# Patient Record
Sex: Female | Born: 1945 | Race: White | Hispanic: No | Smoking: Never smoker
Health system: Southern US, Community
[De-identification: ages and names within clinical notes are randomized; demographics above are authoritative.]

## PROBLEM LIST (undated history)

## (undated) DIAGNOSIS — T753XXA Motion sickness, initial encounter: Secondary | ICD-10-CM

## (undated) DIAGNOSIS — E782 Mixed hyperlipidemia: Secondary | ICD-10-CM

## (undated) DIAGNOSIS — N840 Polyp of corpus uteri: Secondary | ICD-10-CM

## (undated) DIAGNOSIS — I10 Essential (primary) hypertension: Secondary | ICD-10-CM

## (undated) DIAGNOSIS — N938 Other specified abnormal uterine and vaginal bleeding: Secondary | ICD-10-CM

## (undated) HISTORY — PX: BREAST SURGERY: SHX581

## (undated) HISTORY — PX: BREAST EXCISIONAL BIOPSY: SUR124

## (undated) HISTORY — DX: Mixed hyperlipidemia: E78.2

## (undated) HISTORY — PX: DILATION AND CURETTAGE OF UTERUS: SHX78

## (undated) HISTORY — PX: TUBAL LIGATION: SHX77

## (undated) HISTORY — DX: Other specified abnormal uterine and vaginal bleeding: N93.8

## (undated) HISTORY — PX: APPENDECTOMY: SHX54

## (undated) HISTORY — DX: Polyp of corpus uteri: N84.0

---

## 1999-09-26 ENCOUNTER — Ambulatory Visit (HOSPITAL_COMMUNITY): Admission: RE | Admit: 1999-09-26 | Discharge: 1999-09-26 | Payer: Self-pay | Admitting: Gastroenterology

## 2000-03-11 ENCOUNTER — Encounter (INDEPENDENT_AMBULATORY_CARE_PROVIDER_SITE_OTHER): Payer: Self-pay

## 2000-03-11 ENCOUNTER — Other Ambulatory Visit: Admission: RE | Admit: 2000-03-11 | Discharge: 2000-03-11 | Payer: Self-pay | Admitting: Obstetrics and Gynecology

## 2003-04-07 ENCOUNTER — Other Ambulatory Visit: Admission: RE | Admit: 2003-04-07 | Discharge: 2003-04-07 | Payer: Self-pay | Admitting: Obstetrics and Gynecology

## 2003-07-31 ENCOUNTER — Encounter: Admission: RE | Admit: 2003-07-31 | Discharge: 2003-07-31 | Payer: Self-pay | Admitting: Gastroenterology

## 2003-07-31 ENCOUNTER — Encounter: Payer: Self-pay | Admitting: Gastroenterology

## 2004-04-09 ENCOUNTER — Other Ambulatory Visit: Admission: RE | Admit: 2004-04-09 | Discharge: 2004-04-09 | Payer: Self-pay | Admitting: Obstetrics and Gynecology

## 2004-09-18 ENCOUNTER — Ambulatory Visit (HOSPITAL_COMMUNITY): Admission: RE | Admit: 2004-09-18 | Discharge: 2004-09-18 | Payer: Self-pay | Admitting: Gastroenterology

## 2005-04-15 ENCOUNTER — Other Ambulatory Visit: Admission: RE | Admit: 2005-04-15 | Discharge: 2005-04-15 | Payer: Self-pay | Admitting: Obstetrics and Gynecology

## 2005-11-06 ENCOUNTER — Encounter (INDEPENDENT_AMBULATORY_CARE_PROVIDER_SITE_OTHER): Payer: Self-pay | Admitting: *Deleted

## 2005-11-06 ENCOUNTER — Ambulatory Visit (HOSPITAL_BASED_OUTPATIENT_CLINIC_OR_DEPARTMENT_OTHER): Admission: RE | Admit: 2005-11-06 | Discharge: 2005-11-06 | Payer: Self-pay | Admitting: Orthopedic Surgery

## 2006-04-21 ENCOUNTER — Other Ambulatory Visit: Admission: RE | Admit: 2006-04-21 | Discharge: 2006-04-21 | Payer: Self-pay | Admitting: Obstetrics and Gynecology

## 2007-04-23 ENCOUNTER — Other Ambulatory Visit: Admission: RE | Admit: 2007-04-23 | Discharge: 2007-04-23 | Payer: Self-pay | Admitting: Obstetrics and Gynecology

## 2008-05-08 ENCOUNTER — Other Ambulatory Visit: Admission: RE | Admit: 2008-05-08 | Discharge: 2008-05-08 | Payer: Self-pay | Admitting: Obstetrics and Gynecology

## 2008-11-06 ENCOUNTER — Encounter: Admission: RE | Admit: 2008-11-06 | Discharge: 2008-11-06 | Payer: Self-pay | Admitting: Gastroenterology

## 2009-05-10 ENCOUNTER — Ambulatory Visit: Payer: Self-pay | Admitting: Obstetrics and Gynecology

## 2009-05-10 ENCOUNTER — Other Ambulatory Visit: Admission: RE | Admit: 2009-05-10 | Discharge: 2009-05-10 | Payer: Self-pay | Admitting: Obstetrics and Gynecology

## 2009-05-10 ENCOUNTER — Encounter: Payer: Self-pay | Admitting: Obstetrics and Gynecology

## 2009-11-13 LAB — HM COLONOSCOPY

## 2010-05-13 ENCOUNTER — Ambulatory Visit: Payer: Self-pay | Admitting: Obstetrics and Gynecology

## 2010-05-13 ENCOUNTER — Other Ambulatory Visit: Admission: RE | Admit: 2010-05-13 | Discharge: 2010-05-13 | Payer: Self-pay | Admitting: Obstetrics and Gynecology

## 2010-10-18 ENCOUNTER — Encounter
Admission: RE | Admit: 2010-10-18 | Discharge: 2010-10-18 | Payer: Self-pay | Source: Home / Self Care | Attending: Gastroenterology | Admitting: Gastroenterology

## 2010-10-21 ENCOUNTER — Ambulatory Visit
Admission: RE | Admit: 2010-10-21 | Discharge: 2010-10-21 | Payer: Self-pay | Source: Home / Self Care | Attending: Obstetrics and Gynecology | Admitting: Obstetrics and Gynecology

## 2010-10-28 ENCOUNTER — Ambulatory Visit
Admission: RE | Admit: 2010-10-28 | Discharge: 2010-10-28 | Payer: Self-pay | Source: Home / Self Care | Attending: Obstetrics and Gynecology | Admitting: Obstetrics and Gynecology

## 2011-02-21 NOTE — Op Note (Signed)
NAME:  [name], [name] NO.:  [id number]   MEDICAL RECORD NO.:  [id number]          PATIENT TYPE:  AMB   LOCATION:  ENDO                         FACILITY:  [facility]   PHYSICIAN:  Danise Edge, M.D.   DATE OF BIRTH:  1945-11-01   DATE OF PROCEDURE:  09/18/2004  DATE OF DISCHARGE:                                 OPERATIVE REPORT   PROCEDURE:  Surveillance colonoscopy.   PROCEDURE INDICATION:  [name] is a 65 year old female, born  12/30/1945.  [name] has a positive family history of colon cancer  and is scheduled to undergo a screening colonoscopy with polypectomy to  prevent colon cancer.   ENDOSCOPIST:  Danise Edge, M.D.   PREMEDICATION:  1.  Versed 6 mg.  2.  Demerol 60 mg.   DESCRIPTION OF PROCEDURE:  After obtaining informed consent, [name] was  placed in the left lateral decubitus position.  I administered intravenous  Demerol and intravenous Versed to achieve conscious sedation for the  procedure.  The patient's blood pressure, oxygen saturation, and cardiac  rhythm were monitored throughout the procedure and documented in the medical  record.   Anal inspection and digital rectal exam were normal.  The Olympus adjustable  pediatric colonoscope was introduced into the rectum and advanced to the  cecum.  Colonic preparation for the exam today was excellent.   RECTUM:  Normal.  SIGMOID COLON AND DESCENDING COLON:  Normal.  SPLENIC FLEXURE:  Normal.  TRANSVERSE COLON:  Normal.  HEPATIC FLEXURE:  Normal.  ASCENDING COLON:  Normal.  CECUM AND ILEOCECAL VALVE:  Normal.   ASSESSMENT:  Normal proctocolonoscopy to the cecum.      Shelia Taylor/MEDQ  D:  09/18/2004  T:  09/18/2004  Job:  [id number]

## 2011-02-21 NOTE — Op Note (Signed)
NAME:  Shelia Taylor, Shelia Taylor             ACCOUNT NO.:  [id number]   MEDICAL RECORD NO.:  [id number]          PATIENT TYPE:  AMB   LOCATION:  DSC                          FACILITY:  [facility]   PHYSICIAN:  Katy Fitch. Sypher, M.D. DATE OF BIRTH:  [birth year]   DATE OF PROCEDURE:  11/06/2005  DATE OF DISCHARGE:                                 OPERATIVE REPORT   PREOPERATIVE DIAGNOSIS:  Mass palmar surface of right ring finger PIP joint  adjacent to flexor sheath.   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   OPERATION:  Excisional biopsy of right ring finger volar mass.   OPERATIONS:  Shelia Taylor, M.D.   ASSISTANT:  Shelia Maduro Dasnoit PA-C.   ANESTHESIA:  Light sedation and 2% lidocaine, 0.25% Marcaine metacarpal head  level block of right ring finger.   SUPERVISING ANESTHESIOLOGIST:  Shelia Taylor.   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   She had noted this several months prior and brought it to Shelia Taylor  attention.   She is referred for excisional biopsy.   After informed consent, she is brought to the operating room at this time.   PROCEDURE:  Shelia Taylor is brought to operating room and placed in  supine position on the operating table.   Following an anesthesia consultation by Shelia Taylor, light sedation and  digital block was selected as appropriate.   Following routine Betadine prep, 0.25% Marcaine and 2% lidocaine were  infiltrated at the metacarpal head level of the right ring finger.   The right arm was prepped with Betadine soap and solution, sterilely draped.  A pneumatic tourniquet was applied to the proximal right brachium.   Following exsanguination of the right hand and arm with an Esmarch bandage,  the arterial tourniquet  was inflated to 220 mmHg.   Procedure commenced with an oblique incision directly over the palpably  enlarged area.  Subcutaneous tissue was carefully divided, revealing a  purplish red mass that appeared to be adherent to both the veins and several  of the cutaneous nerves of this region.   This raised the question of a possible glomus tumor.   This did not have the appearance of a typical ganglion. We have, however,  seen mucinous lesions grow in the walls of veins. Therefore, this could be  in a typical ganglion type process.   The mass was circumferentially dissected, separated from the dermis and  resected with small portion of the feeding veins and the nerves adjacent to  it.   This was passed off in formalin for pathologic evaluation.   The wound is then repaired with interrupted sutures of 5-0 nylon.   A compressive dressing was applied.   There were no apparent complications.      Katy Fitch Sypher, M.D.  Electronically Signed     RVS/MEDQ  D:  11/06/2005  T:  11/06/2005  Job:  [id number]

## 2011-07-08 ENCOUNTER — Encounter: Payer: Self-pay | Admitting: Gynecology

## 2011-07-08 DIAGNOSIS — N938 Other specified abnormal uterine and vaginal bleeding: Secondary | ICD-10-CM | POA: Insufficient documentation

## 2011-07-08 DIAGNOSIS — M810 Age-related osteoporosis without current pathological fracture: Secondary | ICD-10-CM | POA: Insufficient documentation

## 2011-07-08 DIAGNOSIS — N840 Polyp of corpus uteri: Secondary | ICD-10-CM | POA: Insufficient documentation

## 2011-07-16 ENCOUNTER — Encounter: Payer: Self-pay | Admitting: Obstetrics and Gynecology

## 2011-08-07 ENCOUNTER — Ambulatory Visit (INDEPENDENT_AMBULATORY_CARE_PROVIDER_SITE_OTHER): Payer: Managed Care, Other (non HMO) | Admitting: Obstetrics and Gynecology

## 2011-08-07 ENCOUNTER — Other Ambulatory Visit (HOSPITAL_COMMUNITY)
Admission: RE | Admit: 2011-08-07 | Discharge: 2011-08-07 | Disposition: A | Discharge status: Home / Self Care | Payer: Managed Care, Other (non HMO) | Source: Ambulatory Visit | Attending: Obstetrics and Gynecology | Admitting: Obstetrics and Gynecology

## 2011-08-07 ENCOUNTER — Encounter: Payer: Self-pay | Admitting: Obstetrics and Gynecology

## 2011-08-07 VITALS — BP 122/70 | Ht 66.5 in | Wt 129.0 lb

## 2011-08-07 DIAGNOSIS — M8580 Other specified disorders of bone density and structure, unspecified site: Secondary | ICD-10-CM

## 2011-08-07 DIAGNOSIS — M949 Disorder of cartilage, unspecified: Secondary | ICD-10-CM

## 2011-08-07 DIAGNOSIS — Z01419 Encounter for gynecological examination (general) (routine) without abnormal findings: Secondary | ICD-10-CM | POA: Insufficient documentation

## 2011-08-07 NOTE — Progress Notes (Signed)
Patient came to see me today for her annual GYN exam. We saw her her in January of this year for left lower quadrant pain. She had a normal ultrasound. She's been evaluated both by her GI doctor and her urologist. So far no significant disorder has been found. She says her pain is almost gone. She remains on Evista for low bone mass. She is doing well and has had no fractures. She is having some back pain and is  Going back to Dr. Laural Benes for reassessment. She's having no vaginal bleeding. She does her lab work through her PCP.  Physical examination: Kennon Portela present HEENT within normal limits. Neck: Thyroid not large. No masses. Supraclavicular nodes: not enlarged. Breasts: Examined in both sitting midline position. No skin changes and no masses. Abdomen: Soft no guarding rebound or masses or hernia. Pelvic: External: Within normal limits. BUS: Within normal limits. Vaginal:within normal limits. Poor estrogen effect. No evidence of cystocele rectocele or enterocele. Cervix: clean. Uterus: Normal size and shape. Adnexa: No masses. Rectovaginal exam: Confirmatory and negative. Extremities: Within normal limits.  Assessment: #1. Left lower quadrant pain resolved #2. Atrophic vaginitis asymptomatic #3. Low bone mass  Plan: Continue Evista. Mammogram and  bone density in December.

## 2011-10-13 ENCOUNTER — Encounter: Payer: Self-pay | Admitting: Obstetrics and Gynecology

## 2011-10-14 ENCOUNTER — Other Ambulatory Visit: Payer: Self-pay | Admitting: Obstetrics and Gynecology

## 2012-10-29 LAB — HM PAP SMEAR: HM Pap smear: NEGATIVE

## 2012-12-13 ENCOUNTER — Ambulatory Visit: Payer: Self-pay | Admitting: Obstetrics and Gynecology

## 2013-01-10 ENCOUNTER — Ambulatory Visit: Payer: Self-pay | Admitting: Emergency Medicine

## 2013-10-10 DIAGNOSIS — H6980 Other specified disorders of Eustachian tube, unspecified ear: Secondary | ICD-10-CM | POA: Diagnosis not present

## 2013-10-21 DIAGNOSIS — H6980 Other specified disorders of Eustachian tube, unspecified ear: Secondary | ICD-10-CM | POA: Diagnosis not present

## 2013-11-04 DIAGNOSIS — H6980 Other specified disorders of Eustachian tube, unspecified ear: Secondary | ICD-10-CM | POA: Diagnosis not present

## 2013-11-11 DIAGNOSIS — Z01419 Encounter for gynecological examination (general) (routine) without abnormal findings: Secondary | ICD-10-CM | POA: Diagnosis not present

## 2013-11-11 DIAGNOSIS — M810 Age-related osteoporosis without current pathological fracture: Secondary | ICD-10-CM | POA: Diagnosis not present

## 2013-11-21 DIAGNOSIS — M949 Disorder of cartilage, unspecified: Secondary | ICD-10-CM | POA: Diagnosis not present

## 2013-11-21 DIAGNOSIS — M899 Disorder of bone, unspecified: Secondary | ICD-10-CM | POA: Diagnosis not present

## 2013-11-21 DIAGNOSIS — Z800 Family history of malignant neoplasm of digestive organs: Secondary | ICD-10-CM | POA: Diagnosis not present

## 2013-11-21 DIAGNOSIS — Z0000 Encounter for general adult medical examination without abnormal findings: Secondary | ICD-10-CM | POA: Diagnosis not present

## 2013-12-21 ENCOUNTER — Ambulatory Visit: Payer: Self-pay | Admitting: Obstetrics and Gynecology

## 2013-12-21 DIAGNOSIS — Z1231 Encounter for screening mammogram for malignant neoplasm of breast: Secondary | ICD-10-CM | POA: Diagnosis not present

## 2013-12-21 DIAGNOSIS — M810 Age-related osteoporosis without current pathological fracture: Secondary | ICD-10-CM | POA: Diagnosis not present

## 2013-12-21 DIAGNOSIS — N958 Other specified menopausal and perimenopausal disorders: Secondary | ICD-10-CM | POA: Diagnosis not present

## 2014-03-16 DIAGNOSIS — L821 Other seborrheic keratosis: Secondary | ICD-10-CM | POA: Diagnosis not present

## 2014-03-16 DIAGNOSIS — D2360 Other benign neoplasm of skin of unspecified upper limb, including shoulder: Secondary | ICD-10-CM | POA: Diagnosis not present

## 2014-08-29 DIAGNOSIS — H2513 Age-related nuclear cataract, bilateral: Secondary | ICD-10-CM | POA: Diagnosis not present

## 2014-09-11 DIAGNOSIS — Z23 Encounter for immunization: Secondary | ICD-10-CM | POA: Diagnosis not present

## 2014-11-16 DIAGNOSIS — Z01419 Encounter for gynecological examination (general) (routine) without abnormal findings: Secondary | ICD-10-CM | POA: Diagnosis not present

## 2014-11-16 DIAGNOSIS — M810 Age-related osteoporosis without current pathological fracture: Secondary | ICD-10-CM | POA: Diagnosis not present

## 2014-12-25 ENCOUNTER — Ambulatory Visit: Payer: Self-pay | Admitting: Obstetrics and Gynecology

## 2014-12-25 DIAGNOSIS — Z1231 Encounter for screening mammogram for malignant neoplasm of breast: Secondary | ICD-10-CM | POA: Diagnosis not present

## 2014-12-25 DIAGNOSIS — R921 Mammographic calcification found on diagnostic imaging of breast: Secondary | ICD-10-CM | POA: Diagnosis not present

## 2015-01-02 ENCOUNTER — Ambulatory Visit: Payer: Self-pay | Admitting: Obstetrics and Gynecology

## 2015-01-02 DIAGNOSIS — N6489 Other specified disorders of breast: Secondary | ICD-10-CM | POA: Diagnosis not present

## 2015-01-31 DIAGNOSIS — Z23 Encounter for immunization: Secondary | ICD-10-CM | POA: Diagnosis not present

## 2015-01-31 DIAGNOSIS — Z0001 Encounter for general adult medical examination with abnormal findings: Secondary | ICD-10-CM | POA: Diagnosis not present

## 2015-01-31 DIAGNOSIS — M8588 Other specified disorders of bone density and structure, other site: Secondary | ICD-10-CM | POA: Diagnosis not present

## 2015-01-31 DIAGNOSIS — Z800 Family history of malignant neoplasm of digestive organs: Secondary | ICD-10-CM | POA: Diagnosis not present

## 2015-03-21 DIAGNOSIS — D1801 Hemangioma of skin and subcutaneous tissue: Secondary | ICD-10-CM | POA: Diagnosis not present

## 2015-03-21 DIAGNOSIS — L821 Other seborrheic keratosis: Secondary | ICD-10-CM | POA: Diagnosis not present

## 2015-03-21 DIAGNOSIS — L718 Other rosacea: Secondary | ICD-10-CM | POA: Diagnosis not present

## 2015-07-04 ENCOUNTER — Ambulatory Visit: Payer: Medicare Other

## 2015-07-04 ENCOUNTER — Encounter: Payer: Self-pay | Admitting: Emergency Medicine

## 2015-07-04 ENCOUNTER — Ambulatory Visit
Admission: EM | Admit: 2015-07-04 | Discharge: 2015-07-04 | Disposition: A | Discharge status: Home / Self Care | Payer: Medicare Other | Attending: Family Medicine | Admitting: Family Medicine

## 2015-07-04 DIAGNOSIS — R0981 Nasal congestion: Secondary | ICD-10-CM | POA: Diagnosis present

## 2015-07-04 DIAGNOSIS — J0100 Acute maxillary sinusitis, unspecified: Secondary | ICD-10-CM | POA: Insufficient documentation

## 2015-07-04 DIAGNOSIS — R05 Cough: Secondary | ICD-10-CM | POA: Diagnosis not present

## 2015-07-04 DIAGNOSIS — J40 Bronchitis, not specified as acute or chronic: Secondary | ICD-10-CM

## 2015-07-04 MED ORDER — PREDNISONE 20 MG PO TABS: 20.0000 mg | ORAL_TABLET | Freq: Every day | ORAL | Status: DC

## 2015-07-04 MED ORDER — BENZONATATE 100 MG PO CAPS: 100.0000 mg | ORAL_CAPSULE | Freq: Three times a day (TID) | ORAL | Status: DC | PRN

## 2015-07-04 MED ORDER — AZITHROMYCIN 250 MG PO TABS: ORAL_TABLET | ORAL | Status: DC

## 2015-07-04 NOTE — ED Provider Notes (Signed)
[facility] Emergency Department Provider Note  ____________________________________________  Time seen: Approximately 9:17 AM  I have reviewed the triage vital signs and the nursing notes.   HISTORY  Chief Complaint Nasal Congestion   HPI Shelia Taylor is a 69 y.o. female presents with a complaint of 2 weeks of runny nose, nasal congestion and sinus pressure. Patient reports the last several days she has had more chest congestion. Patient states that she frequently is able to cough up phlegm from nose as well as coughing up phlegm. States productive cough with whitish to grayish sputum.Denies wheezing, fever, chest pain or shortness of breath. Patient reports cough is worse at night with postnasal drip. Reports continues to eat and drink well. Reports symptoms unrelieved with over-the-counter Mucinex.    Past Medical History  Diagnosis Date  . Endometrial polyp   . Osteoporosis   . DUB (dysfunctional uterine bleeding)     Patient Active Problem List   Diagnosis Date Noted  . Endometrial polyp   . Osteoporosis   . DUB (dysfunctional uterine bleeding)     Past Surgical History  Procedure Laterality Date  . Appendectomy    . Dilation and curettage of uterus    . Tubal ligation    . Breast surgery      Biopsy    Current Outpatient Rx  Name  Route  Sig  Dispense  Refill  . Calcium Carbonate-Vitamin D (CALCIUM + D PO)   Oral   Take by mouth 2 (two) times daily.           . Cholecalciferol (VITAMIN D PO)   Oral   Take 1,000 Units by mouth.           . EVISTA 60 MG tablet      TAKE 1 TABLET DAILY   90 tablet   11   . Multiple Vitamin (MULTIVITAMIN) tablet   Oral   Take 1 tablet by mouth daily.             Allergies Review of patient's allergies indicates no known allergies.  Family History  Problem Relation Age of Onset  . Diabetes Mother   . Hypertension Father   . Cancer Father     colon cancer  . Diabetes Sister    . Breast cancer Cousin     Paternal 1st cousin    Social History Social History  Substance Use Topics  . Smoking status: Never Smoker   . Smokeless tobacco: None  . Alcohol Use: 3.0 oz/week    6 drink(s) per week    Review of Systems Constitutional: No fever/chills Eyes: No visual changes. ENT: positive for runny nose, congestion, sinus drainage.  Cardiovascular: Denies chest pain. Respiratory: Denies shortness of breath. Positive for cough and congestion.  Gastrointestinal: No abdominal pain.  No nausea, no vomiting.  No diarrhea.  No constipation. Genitourinary: Negative for dysuria. Musculoskeletal: Negative for back pain. Skin: Negative for rash. Neurological: Negative for headaches, focal weakness or numbness.  10-point ROS otherwise negative.  ____________________________________________   PHYSICAL EXAM:  VITAL SIGNS: ED Triage Vitals  Enc Vitals Group     BP 07/04/15 0840 152/64 mmHg     Pulse Rate 07/04/15 0840 70     Resp 07/04/15 0840 20     Temp 07/04/15 0840 98.7 F (37.1 C)     Temp Source 07/04/15 0840 Tympanic     SpO2 07/04/15 0840 99 %     Weight 07/04/15 0840 138 lb (62.596 kg)  Height 07/04/15 0840 5' 8.5" (1.74 m)     Head Cir --      Peak Flow --      Pain Score 07/04/15 0843 5     Pain Loc --      Pain Edu? --      Excl. in [place]? --     Constitutional: Alert and oriented. Well appearing and in no acute distress. Eyes: Conjunctivae are normal. PERRL. EOMI. Head: Atraumatic. Mild TTP maxillary sinus TTP. No swelling or erythema.   Ears: no erythema, non bulging TMs, bilateral dullness. Left ear with fluid present present at TM, no exudate or drainage.    Nose: mild rhinorrhea, bilateral nasal turbinate bogginess and edema, nares patent.   Mouth/Throat: Mucous membranes are moist.  Oropharynx non-erythematous. Neck: No stridor.  No cervical spine tenderness to palpation. Hematological/Lymphatic/Immunilogical: No cervical  lymphadenopathy. Cardiovascular: Normal rate, regular rhythm. Grossly normal heart sounds.  Good peripheral circulation. Respiratory: Normal respiratory effort.  No retractions. No wheezes or rales. Mild scattered base rhonchi.  Gastrointestinal: Soft and nontender. No distention. Normal Bowel sounds.  No abdominal bruits. No CVA tenderness. Musculoskeletal: No lower or upper extremity tenderness nor edema.  No joint effusions. Bilateral pedal pulses equal and easily palpated.  Neurologic:  Normal speech and language. No gross focal neurologic deficits are appreciated. No gait instability. Skin:  Skin is warm, dry and intact. No rash noted. Psychiatric: Mood and affect are normal. Speech and behavior are normal.  ____________________________________________   LABS (all labs ordered are listed, but only abnormal results are displayed)  Labs Reviewed - No data to display ____________________________________________  RADIOLOGY  EXAM: CHEST 2 VIEW  COMPARISON: CT abdomen and pelvis 10/18/2010  FINDINGS: Cardiac silhouette is within normal limits. Small, calcified left hilar lymph nodes are noted. 7 mm calcified granuloma in the left lower lobe is unchanged. There is mild biapical pleural thickening. No confluent airspace opacity, pulmonary edema, pleural effusion, or pneumothorax is identified. Mild upper lumbar dextroscoliosis is noted.  IMPRESSION: 1. No active cardiopulmonary disease. 2. Prior granulomatous disease.   Electronically Signed By: Logan Bores M.D. On: 07/04/2015 09:29  I, Marylene Land, personally viewed and evaluated these images (plain radiographs) as part of my medical decision making.   ____________________________________________   INITIAL IMPRESSION / ASSESSMENT AND PLAN / ED COURSE  Pertinent labs & imaging results that were available during my care of the patient were reviewed by me and considered in my medical decision making (see chart for  details).  Very well-appearing patient. No acute distress. Presents for complaints of 2 weeks of runny nose, cough, congestion, sinus pressure and chest congestion. Patient was scattered basilar rhonchi Will evaluate chest x-ray. No wheezes or rales. Good air movement. Suspect bronchitis.  Chest xray negative for acute cardiopulmonary disease. Will treat bronchitis with po azithromycin, 3 course low dose prednisone, and tessalon perles as needed. Discussed supportive treatments including rest, fluids. Discussed follow up with Primary care physician this week. Discussed follow up and return parameters including no resolution or any worsening concerns. Patient verbalized understanding and agreed to plan.   ____________________________________________   FINAL CLINICAL IMPRESSION(S) / ED DIAGNOSES  Final diagnoses:  Bronchitis  Acute maxillary sinusitis, recurrence not specified       Marylene Land, NP 07/04/15 [phone]

## 2015-07-04 NOTE — Discharge Instructions (Signed)
Take medication as prescribed. Rest. Follow up closely with your primary care physician. Return to Urgent care for new or worsening concerns.   Sinusitis Sinusitis is redness, soreness, and inflammation of the paranasal sinuses. Paranasal sinuses are air pockets within the bones of your face (beneath the eyes, the middle of the forehead, or above the eyes). In healthy paranasal sinuses, mucus is able to drain out, and air is able to circulate through them by way of your nose. However, when your paranasal sinuses are inflamed, mucus and air can become trapped. This can allow bacteria and other germs to grow and cause infection. Sinusitis can develop quickly and last only a short time (acute) or continue over a long period (chronic). Sinusitis that lasts for more than 12 weeks is considered chronic.  CAUSES  Causes of sinusitis include:  Allergies.  Structural abnormalities, such as displacement of the cartilage that separates your nostrils (deviated septum), which can decrease the air flow through your nose and sinuses and affect sinus drainage.  Functional abnormalities, such as when the small hairs (cilia) that line your sinuses and help remove mucus do not work properly or are not present. SIGNS AND SYMPTOMS  Symptoms of acute and chronic sinusitis are the same. The primary symptoms are pain and pressure around the affected sinuses. Other symptoms include:  Upper toothache.  Earache.  Headache.  Bad breath.  Decreased sense of smell and taste.  A cough, which worsens when you are lying flat.  Fatigue.  Fever.  Thick drainage from your nose, which often is green and may contain pus (purulent).  Swelling and warmth over the affected sinuses. DIAGNOSIS  Your health care provider will perform a physical exam. During the exam, your health care provider may:  Look in your nose for signs of abnormal growths in your nostrils (nasal polyps).  Tap over the affected sinus to check for  signs of infection.  View the inside of your sinuses (endoscopy) using an imaging device that has a light attached (endoscope). If your health care provider suspects that you have chronic sinusitis, one or more of the following tests may be recommended:  Allergy tests.  Nasal culture. A sample of mucus is taken from your nose, sent to a lab, and screened for bacteria.  Nasal cytology. A sample of mucus is taken from your nose and examined by your health care provider to determine if your sinusitis is related to an allergy. TREATMENT  Most cases of acute sinusitis are related to a viral infection and will resolve on their own within 10 days. Sometimes medicines are prescribed to help relieve symptoms (pain medicine, decongestants, nasal steroid sprays, or saline sprays).  However, for sinusitis related to a bacterial infection, your health care provider will prescribe antibiotic medicines. These are medicines that will help kill the bacteria causing the infection.  Rarely, sinusitis is caused by a fungal infection. In theses cases, your health care provider will prescribe antifungal medicine. For some cases of chronic sinusitis, surgery is needed. Generally, these are cases in which sinusitis recurs more than 3 times per year, despite other treatments. HOME CARE INSTRUCTIONS   Drink plenty of water. Water helps thin the mucus so your sinuses can drain more easily.  Use a humidifier.  Inhale steam 3 to 4 times a day (for example, sit in the bathroom with the shower running).  Apply a warm, moist washcloth to your face 3 to 4 times a day, or as directed by your health care provider.  Use saline nasal sprays to help moisten and clean your sinuses.  Take medicines only as directed by your health care provider.  If you were prescribed either an antibiotic or antifungal medicine, finish it all even if you start to feel better. SEEK IMMEDIATE MEDICAL CARE IF:  You have increasing pain or  severe headaches.  You have nausea, vomiting, or drowsiness.  You have swelling around your face.  You have vision problems.  You have a stiff neck.  You have difficulty breathing. MAKE SURE YOU:   Understand these instructions.  Will watch your condition.  Will get help right away if you are not doing well or get worse. Document Released: 09/22/2005 Document Revised: 02/06/2014 Document Reviewed: 10/07/2011 [facility] Patient Information 2015 [place], [place]. This information is not intended to replace advice given to you by your health care provider. Make sure you discuss any questions you have with your health care provider.

## 2015-07-04 NOTE — ED Notes (Signed)
Pt with head nasal congestion and cough x days

## 2015-09-11 DIAGNOSIS — H2513 Age-related nuclear cataract, bilateral: Secondary | ICD-10-CM | POA: Diagnosis not present

## 2015-09-13 DIAGNOSIS — Z23 Encounter for immunization: Secondary | ICD-10-CM | POA: Diagnosis not present

## 2015-10-07 HISTORY — PX: CATARACT EXTRACTION, BILATERAL: SHX1313

## 2015-11-05 DIAGNOSIS — H25813 Combined forms of age-related cataract, bilateral: Secondary | ICD-10-CM | POA: Diagnosis not present

## 2015-11-22 ENCOUNTER — Encounter: Payer: Medicare Other | Admitting: Obstetrics and Gynecology

## 2015-12-04 DIAGNOSIS — Z79899 Other long term (current) drug therapy: Secondary | ICD-10-CM | POA: Diagnosis not present

## 2015-12-04 DIAGNOSIS — M810 Age-related osteoporosis without current pathological fracture: Secondary | ICD-10-CM | POA: Diagnosis not present

## 2015-12-04 DIAGNOSIS — H2512 Age-related nuclear cataract, left eye: Secondary | ICD-10-CM | POA: Diagnosis not present

## 2015-12-04 DIAGNOSIS — H52222 Regular astigmatism, left eye: Secondary | ICD-10-CM | POA: Diagnosis not present

## 2015-12-25 DIAGNOSIS — H2511 Age-related nuclear cataract, right eye: Secondary | ICD-10-CM | POA: Diagnosis not present

## 2015-12-25 DIAGNOSIS — H52221 Regular astigmatism, right eye: Secondary | ICD-10-CM | POA: Diagnosis not present

## 2015-12-25 DIAGNOSIS — Z79899 Other long term (current) drug therapy: Secondary | ICD-10-CM | POA: Diagnosis not present

## 2016-01-01 ENCOUNTER — Encounter: Payer: Self-pay | Admitting: Obstetrics and Gynecology

## 2016-01-01 ENCOUNTER — Ambulatory Visit (INDEPENDENT_AMBULATORY_CARE_PROVIDER_SITE_OTHER): Payer: Medicare Other | Admitting: Obstetrics and Gynecology

## 2016-01-01 VITALS — BP 139/64 | HR 77 | Ht 67.0 in | Wt 139.0 lb

## 2016-01-01 DIAGNOSIS — Z01419 Encounter for gynecological examination (general) (routine) without abnormal findings: Secondary | ICD-10-CM | POA: Diagnosis not present

## 2016-01-01 DIAGNOSIS — R922 Inconclusive mammogram: Secondary | ICD-10-CM | POA: Diagnosis not present

## 2016-01-01 DIAGNOSIS — Z1231 Encounter for screening mammogram for malignant neoplasm of breast: Secondary | ICD-10-CM

## 2016-01-01 DIAGNOSIS — M810 Age-related osteoporosis without current pathological fracture: Secondary | ICD-10-CM | POA: Diagnosis not present

## 2016-01-01 DIAGNOSIS — Z1239 Encounter for other screening for malignant neoplasm of breast: Secondary | ICD-10-CM

## 2016-01-01 MED ORDER — ALENDRONATE SODIUM 70 MG PO TABS: 70.0000 mg | ORAL_TABLET | ORAL | Status: DC

## 2016-01-01 NOTE — Patient Instructions (Signed)
Place annual gynecologic exam patient instructions here.

## 2016-01-01 NOTE — Progress Notes (Signed)
Subjective:   Shelia Taylor is a 70 y.o. G0P0 Caucasian female here for a routine well-woman exam.  No LMP recorded. Patient is postmenopausal.    Current complaints: none PCP: ?       Doesn't need labs  Social History: Sexual: heterosexual Marital Status: married Living situation: with spouse Occupation: retired Tobacco/alcohol: no tobacco use Illicit drugs: no history of illicit drug use  The following portions of the patient's history were reviewed and updated as appropriate: allergies, current medications, past family history, past medical history, past social history, past surgical history and problem list.  Past Medical History Past Medical History  Diagnosis Date  . Endometrial polyp   . Osteoporosis   . DUB (dysfunctional uterine bleeding)     Past Surgical History Past Surgical History  Procedure Laterality Date  . Appendectomy    . Dilation and curettage of uterus    . Tubal ligation    . Breast surgery      Biopsy    Gynecologic History G0P0  No LMP recorded. Patient is postmenopausal. Contraception: post menopausal status Last Pap: 2016. Results were: normal Last mammogram: 2016. Results were: abnormal- increased density with normal f/u   Obstetric History OB History  Gravida Para Term Preterm AB SAB TAB Ectopic Multiple Living  0                 Current Medications Current Outpatient Prescriptions on File Prior to Visit  Medication Sig Dispense Refill  . Calcium Carbonate-Vitamin D (CALCIUM + D PO) Take by mouth 2 (two) times daily.      . Cholecalciferol (VITAMIN D PO) Take 1,000 Units by mouth.      . Multiple Vitamin (MULTIVITAMIN) tablet Take 1 tablet by mouth daily.      Marland Kitchen azithromycin (ZITHROMAX Z-PAK) 250 MG tablet Take 2 tablets (500 mg) on  Day 1,  followed by 1 tablet (250 mg) once daily on Days 2 through 5. (Patient not taking: Reported on 01/01/2016) 6 each 0  . benzonatate (TESSALON PERLES) 100 MG capsule Take 1 capsule (100 mg  total) by mouth 3 (three) times daily as needed for cough. (Patient not taking: Reported on 01/01/2016) 15 capsule 0  . EVISTA 60 MG tablet TAKE 1 TABLET DAILY (Patient not taking: Reported on 01/01/2016) 90 tablet 11  . predniSONE (DELTASONE) 20 MG tablet Take 1 tablet (20 mg total) by mouth daily. (Patient not taking: Reported on 01/01/2016) 3 tablet 0   No current facility-administered medications on file prior to visit.    Review of Systems Patient denies any headaches, blurred vision, shortness of breath, chest pain, abdominal pain, problems with bowel movements, urination, or intercourse.  Objective:  BP 139/64 mmHg  Pulse 77  Ht 5\' 7"  (1.702 m)  Wt 139 lb (63.05 kg)  BMI 21.77 kg/m2 Physical Exam  General:  Well developed, well nourished, no acute distress. She is alert and oriented x3. Skin:  Warm and dry Neck:  Midline trachea, no thyromegaly or nodules Cardiovascular: Regular rate and rhythm, no murmur heard Lungs:  Effort normal, all lung fields clear to auscultation bilaterally Breasts:  No dominant palpable mass, retraction, or nipple discharge Abdomen:  Soft, non tender, no hepatosplenomegaly or masses Pelvic:  External genitalia is normal in appearance.  The vagina is normal in appearance. The cervix is bulbous, no CMT.  Thin prep pap is not done. Uterus is felt to be normal size, shape, and contour.  No adnexal masses or tenderness noted. Extremities:  No swelling or varicosities noted Psych:  She has a normal mood and affect  Assessment:   Healthy well-woman exam Osteoporosis Post-menopausal  Plan:  fosemax refilled F/U 1 year for AE, or sooner if needed Mammogram scheduled BDS scheduled Melody Rockney Ghee, CNM

## 2016-01-13 IMAGING — MG MM DIGITAL DIAGNOSTIC BILAT W/ TOMO W/ CAD
8 of 12 series · 8 of 28 positions shown · non-contrast
Comparison: 12/25/2014, additional prior studies dating back to
04/04/10

CLINICAL DATA: 68-year-old female, callback from screening
mammogram for possible bilateral breast asymmetries

EXAM:
DIGITAL DIAGNOSTIC BILATERAL MAMMOGRAM WITH 3D TOMOSYNTHESIS WITH
CAD
ULTRASOUND RIGHT BREAST

[R CC synth-2D]
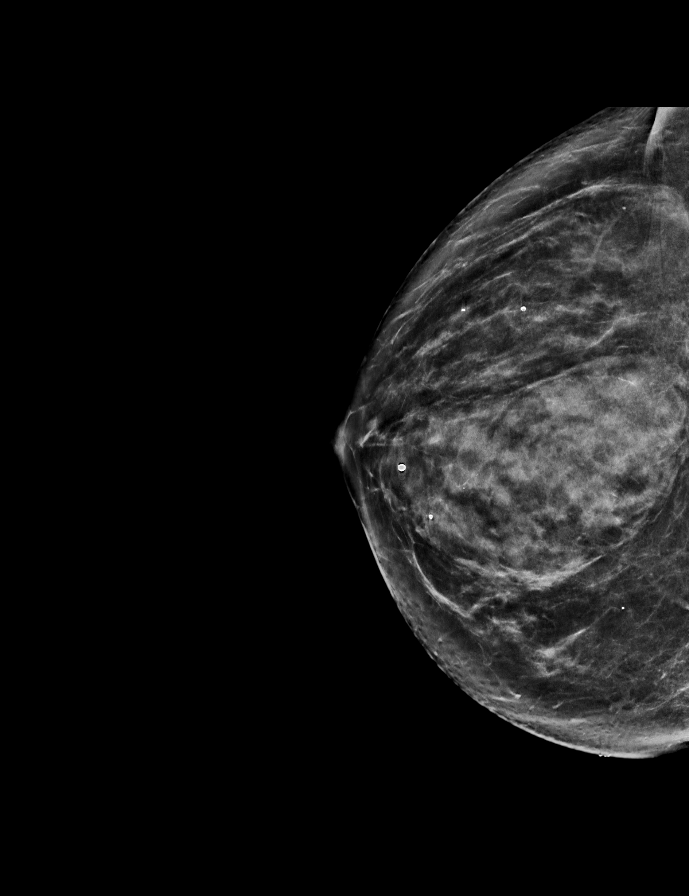

[R MLO synth-2D]
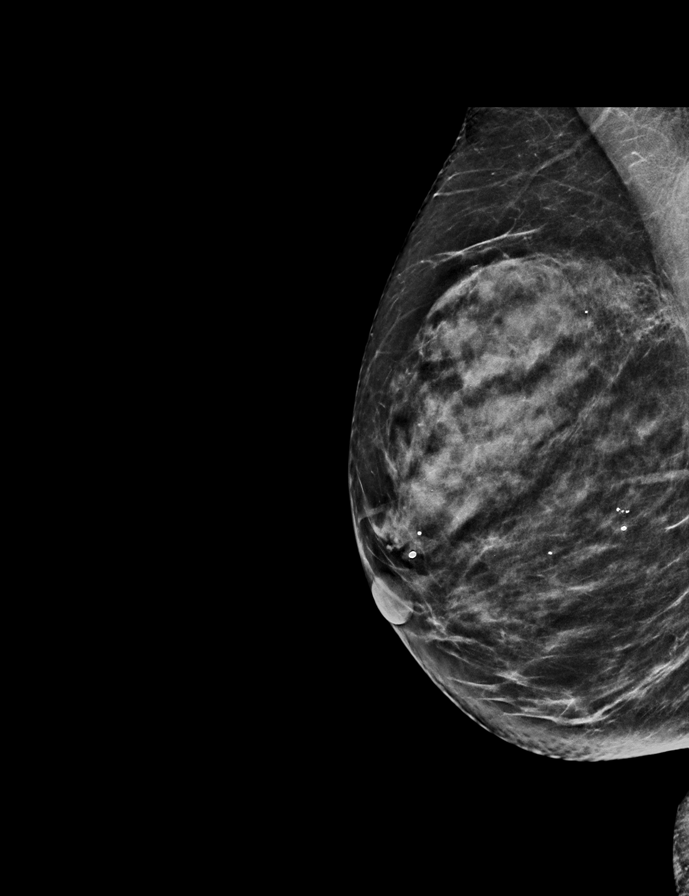

[R CC]
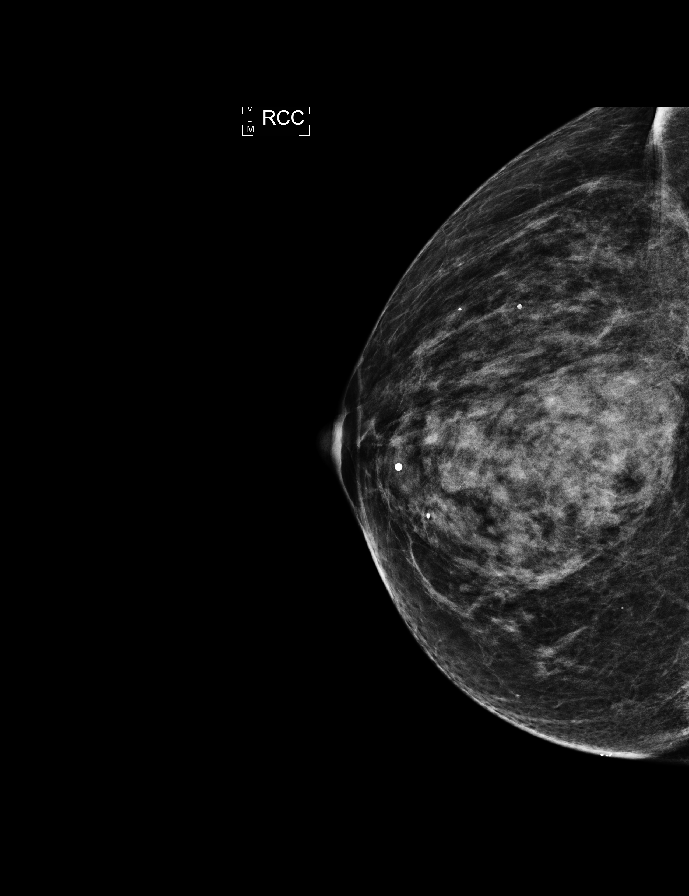

[L MLO]
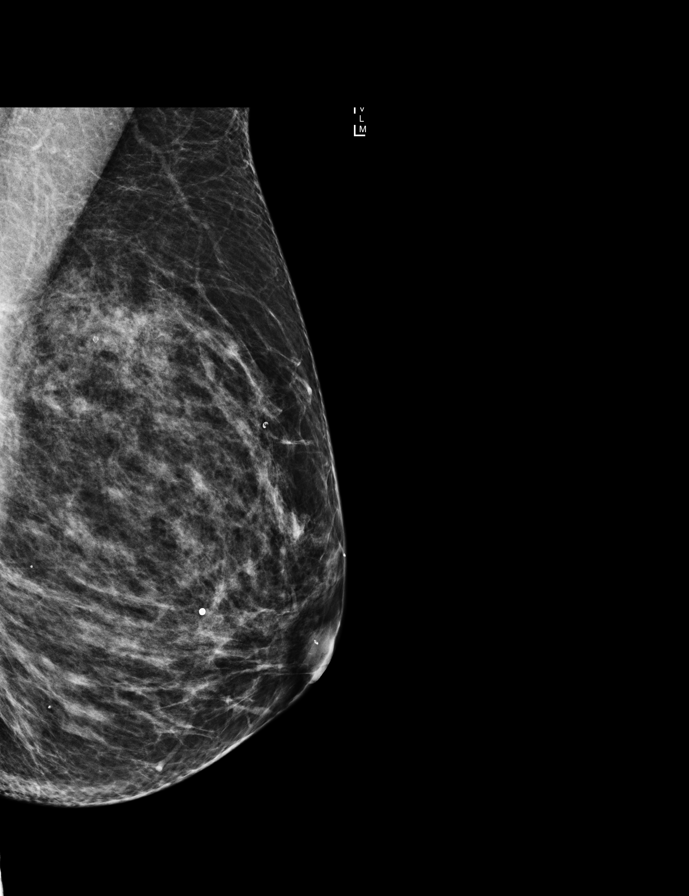

[L CC]
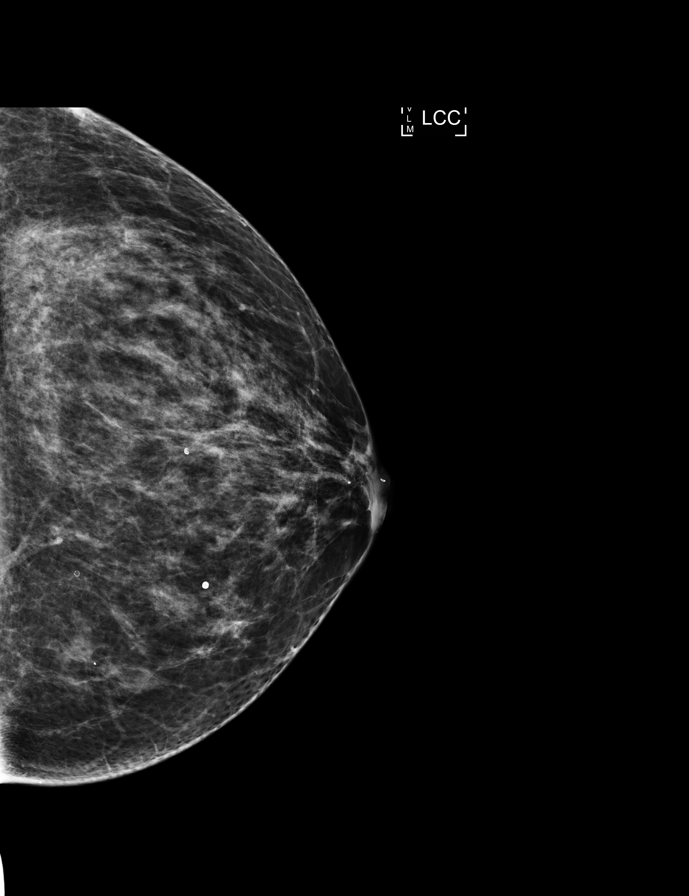

[L MLO synth-2D]
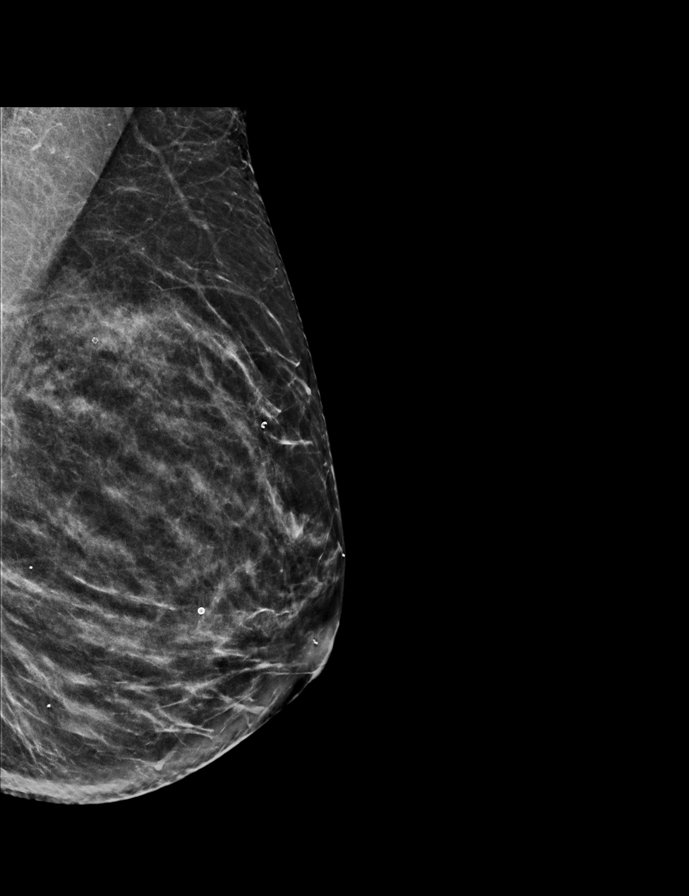

[R MLO]
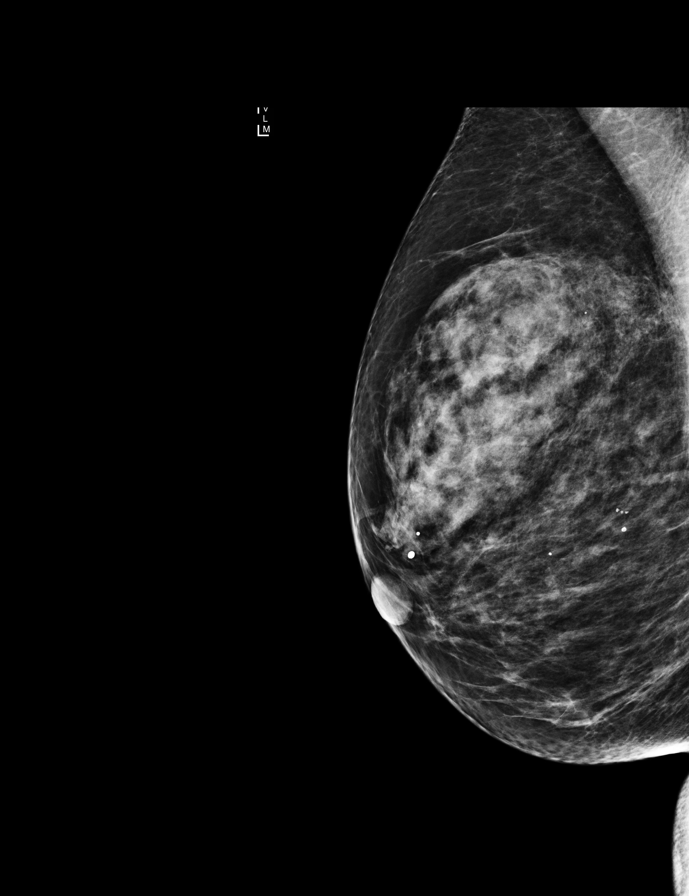

[L CC synth-2D]
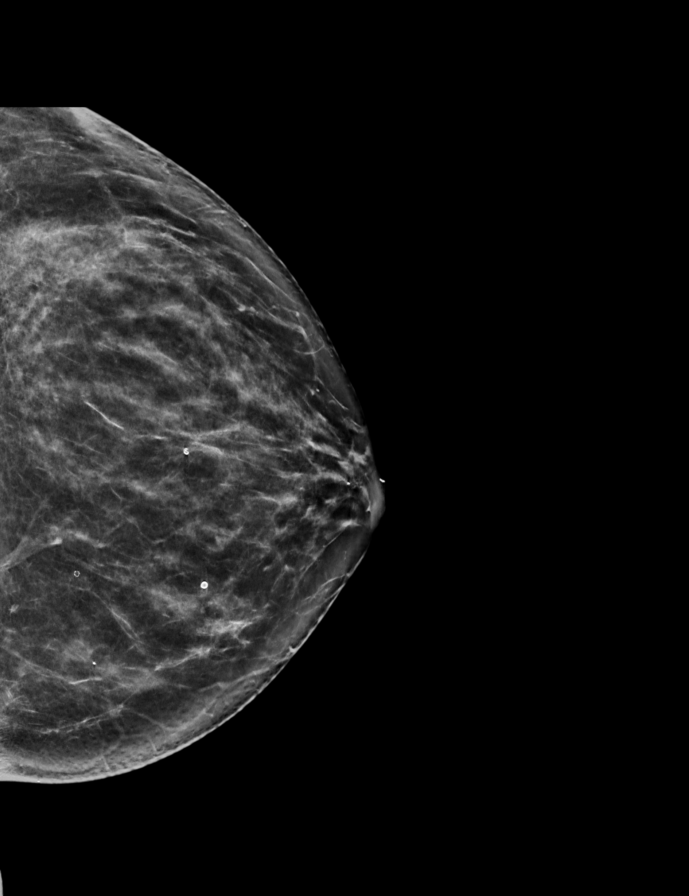

[8 of 28 positions shown; findings below may reference images not displayed]

ACR Breast Density Category c: The breast tissue is heterogeneously
dense, which may obscure small masses.
FINDINGS: Additional views of the left breast demonstrate no persistent
asymmetry in the area of concern within the superior left breast.
The breast parenchymal pattern in this region appears unchanged
compared to prior exams. The possible asymmetry identified within
the slightly lateral right breast on the CC view of the screening
mammogram is significantly less prominent on today's views. No
suspicious mass is identified in this area on the tomosynthesis
images. The tissue in this region appears similar to prior exams.

Mammographic images were processed with CAD.

On physical exam, no discrete mass is felt within the area of
concern within the slightly lateral right breast.

Targeted ultrasound is performed, showing no suspicious cystic or
solid sonographic finding in the area of concern within the right
breast.
IMPRESSION: No mammographic or sonographic evidence of malignancy.

RECOMMENDATION:
Screening mammogram in one year.(Code:F2-9-ASX)

I have discussed the findings and recommendations with the patient.
Results were also provided in writing at the conclusion of the
visit. If applicable, a reminder letter will be sent to the patient
regarding the next appointment.

BI-RADS CATEGORY  1: Negative.

## 2016-02-06 DIAGNOSIS — Z13820 Encounter for screening for osteoporosis: Secondary | ICD-10-CM | POA: Diagnosis not present

## 2016-02-06 DIAGNOSIS — Z1389 Encounter for screening for other disorder: Secondary | ICD-10-CM | POA: Diagnosis not present

## 2016-02-06 DIAGNOSIS — Z7189 Other specified counseling: Secondary | ICD-10-CM | POA: Diagnosis not present

## 2016-02-06 DIAGNOSIS — Z0000 Encounter for general adult medical examination without abnormal findings: Secondary | ICD-10-CM | POA: Diagnosis not present

## 2016-02-19 ENCOUNTER — Other Ambulatory Visit: Payer: Self-pay | Admitting: Obstetrics and Gynecology

## 2016-02-19 ENCOUNTER — Ambulatory Visit
Admission: RE | Admit: 2016-02-19 | Discharge: 2016-02-19 | Disposition: A | Discharge status: Home / Self Care | Payer: Medicare Other | Source: Ambulatory Visit | Attending: Obstetrics and Gynecology | Admitting: Obstetrics and Gynecology

## 2016-02-19 DIAGNOSIS — Z780 Asymptomatic menopausal state: Secondary | ICD-10-CM | POA: Diagnosis not present

## 2016-02-19 DIAGNOSIS — Z1239 Encounter for other screening for malignant neoplasm of breast: Secondary | ICD-10-CM

## 2016-02-19 DIAGNOSIS — M8588 Other specified disorders of bone density and structure, other site: Secondary | ICD-10-CM | POA: Insufficient documentation

## 2016-02-19 DIAGNOSIS — Z1231 Encounter for screening mammogram for malignant neoplasm of breast: Secondary | ICD-10-CM | POA: Insufficient documentation

## 2016-02-19 DIAGNOSIS — M810 Age-related osteoporosis without current pathological fracture: Secondary | ICD-10-CM | POA: Insufficient documentation

## 2016-03-20 DIAGNOSIS — L821 Other seborrheic keratosis: Secondary | ICD-10-CM | POA: Diagnosis not present

## 2016-03-20 DIAGNOSIS — L918 Other hypertrophic disorders of the skin: Secondary | ICD-10-CM | POA: Diagnosis not present

## 2016-07-11 ENCOUNTER — Ambulatory Visit
Admission: EM | Admit: 2016-07-11 | Discharge: 2016-07-11 | Disposition: A | Discharge status: Home / Self Care | Payer: Medicare Other | Attending: Family Medicine | Admitting: Family Medicine

## 2016-07-11 DIAGNOSIS — J0100 Acute maxillary sinusitis, unspecified: Secondary | ICD-10-CM | POA: Diagnosis not present

## 2016-07-11 DIAGNOSIS — J40 Bronchitis, not specified as acute or chronic: Secondary | ICD-10-CM

## 2016-07-11 MED ORDER — DOXYCYCLINE HYCLATE 100 MG PO CAPS: 100.0000 mg | ORAL_CAPSULE | Freq: Two times a day (BID) | ORAL | 0 refills | Status: DC

## 2016-07-11 NOTE — ED Triage Notes (Signed)
Patient c/o allergies, coughing during the night and now its causing a lot pressure in her head and chest. She says she is blowing out yellow snot.

## 2016-07-11 NOTE — ED Provider Notes (Signed)
[facility] ____________________________________________  Time seen: Approximately 9:25 AM  I have reviewed the triage vital signs and the nursing notes.   HISTORY  Chief Complaint Nasal Congestion   HPI Shelia Taylor is a 70 y.o. female presents for the complaints of 1.5 weeks of runny nose, nasal congestion, sinus pressure, sinus drainage, postnasal drainage and cough. Patient reports her biggest complaint is the nasal congestion in her face and sinuses. Patient reports she has taken some over-the-counter Claritin and Flonase without much improvement. Patient does reports some history of seasonal allergies. Denies known sick contacts. Patient reports recently blowing her nose was getting thick yellow drainage.  Denies fevers. Denies chest pain, shortness of breath, abdominal pain, dysuria, dizziness, vision changes, weakness or other complaints. Denies recent sickness or recent antibiotic use. Denies recent hospitalizations.  Garlan Fair, MD PCP   Past Medical History:  Diagnosis Date  . DUB (dysfunctional uterine bleeding)   . Endometrial polyp   . Osteoporosis     Patient Active Problem List   Diagnosis Date Noted  . Endometrial polyp   . Osteoporosis   . DUB (dysfunctional uterine bleeding)     Past Surgical History:  Procedure Laterality Date  . APPENDECTOMY    . BREAST EXCISIONAL BIOPSY Left 1970's   surgical bx  . BREAST SURGERY     Biopsy  . DILATION AND CURETTAGE OF UTERUS    . TUBAL LIGATION      Current Outpatient Rx  . Order #: [id number] Class: Normal  . Order #: [id number] Class: Historical Med  . Order #: [id number] Class: Historical Med  . Order #: [id number] Class: Historical Med  . Order #: [id number] Class: Normal  . Order #: [id number] Class: Normal  . Order #: [id number] Class: Normal  . Order #: [id number] Class: Normal  . Order #: [id number] Class: Normal    No current facility-administered medications for this encounter.   Current  Outpatient Prescriptions:  .  alendronate (FOSAMAX) 70 MG tablet, Take 1 tablet (70 mg total) by mouth once a week. Take with a full glass of water on an empty stomach., Disp: 12 tablet, Rfl: 4 .  Calcium Carbonate-Vitamin D (CALCIUM + D PO), Take by mouth 2 (two) times daily.  , Disp: , Rfl:  .  Cholecalciferol (VITAMIN D PO), Take 1,000 Units by mouth.  , Disp: , Rfl:  .  Multiple Vitamin (MULTIVITAMIN) tablet, Take 1 tablet by mouth daily.  , Disp: , Rfl:  .  azithromycin (ZITHROMAX Z-PAK) 250 MG tablet, Take 2 tablets (500 mg) on  Day 1,  followed by 1 tablet (250 mg) once daily on Days 2 through 5. (Patient not taking: Reported on 01/01/2016), Disp: 6 each, Rfl: 0 .  benzonatate (TESSALON PERLES) 100 MG capsule, Take 1 capsule (100 mg total) by mouth 3 (three) times daily as needed for cough. (Patient not taking: Reported on 01/01/2016), Disp: 15 capsule, Rfl: 0 .  doxycycline (VIBRAMYCIN) 100 MG capsule, Take 1 capsule (100 mg total) by mouth 2 (two) times daily., Disp: 20 capsule, Rfl: 0 .  EVISTA 60 MG tablet, TAKE 1 TABLET DAILY (Patient not taking: Reported on 01/01/2016), Disp: 90 tablet, Rfl: 11 .  predniSONE (DELTASONE) 20 MG tablet, Take 1 tablet (20 mg total) by mouth daily. (Patient not taking: Reported on 01/01/2016), Disp: 3 tablet, Rfl: 0  Allergies Review of patient's allergies indicates no known allergies.  Family History  Problem Relation Age of Onset  . Diabetes Mother   . Hypertension Father   . Cancer Father  colon cancer  . Diabetes Sister   . Breast cancer Cousin     Paternal 1st cousin    Social History Social History  Substance Use Topics  . Smoking status: Never Smoker  . Smokeless tobacco: Not on file  . Alcohol use 3.0 oz/week    6 drink(s) per week    Review of Systems Constitutional: No fever/chills Eyes: No visual changes. ENT: As above. Cardiovascular: Denies chest pain. Respiratory: Denies shortness of breath. Gastrointestinal: No abdominal  pain.  No nausea, no vomiting.  No diarrhea.  No constipation. Genitourinary: Negative for dysuria. Musculoskeletal: Negative for back pain. Skin: Negative for rash. Neurological: Negative for headaches, focal weakness or numbness.  10-point ROS otherwise negative.  ____________________________________________   PHYSICAL EXAM:  VITAL SIGNS: ED Triage Vitals  Enc Vitals Group     BP 07/11/16 0909 (!) 156/56     Pulse Rate 07/11/16 0909 78     Resp 07/11/16 0909 16     Temp 07/11/16 0909 98.1 F (36.7 C)     Temp Source 07/11/16 0909 Oral     SpO2 07/11/16 0909 100 %     Weight 07/11/16 0908 135 lb (61.2 kg)     Height 07/11/16 0908 5\' 7"  (1.702 m)     Head Circumference --      Peak Flow --      Pain Score --      Pain Loc --      Pain Edu? --      Excl. in [place]? --     Constitutional: Alert and oriented. Well appearing and in no acute distress. Eyes: Conjunctivae are normal. PERRL. EOMI. Head: Atraumatic.Mild tenderness to palpation bilateral frontal and maxillary sinuses. No swelling. No erythema.   Ears: no erythema, normal TMs bilaterally.   Nose: nasal congestion with bilateral nasal turbinate erythema and edema.   Mouth/Throat: Mucous membranes are moist.  Oropharynx non-erythematous.No tonsillar swelling or exudate.  Neck: No stridor.  No cervical spine tenderness to palpation. Hematological/Lymphatic/Immunilogical: No cervical lymphadenopathy. Cardiovascular: Normal rate, regular rhythm. Grossly normal heart sounds.  Good peripheral circulation. Respiratory: Normal respiratory effort.  No retractions. Lungs CTAB. No wheezes, rales or rhonchi. Good air movement. Occasional cough noted in room. Musculoskeletal: Ambulatory with steady gait.  Neurologic:  Normal speech and language. No gross focal neurologic deficits are appreciated. No gait instability. Skin:  Skin is warm, dry and intact. No rash noted. Psychiatric: Mood and affect are normal. Speech and behavior are  normal.  ___________________________________________   LABS (all labs ordered are listed, but only abnormal results are displayed)  Labs Reviewed - No data to display  PROCEDURES Procedures     INITIAL IMPRESSION / ASSESSMENT AND PLAN / ED COURSE  Pertinent labs & imaging results that were available during my care of the patient were reviewed by me and considered in my medical decision making (see chart for details).  Well-appearing patient. No acute distress. Presents for complaint of 1.5 weeks of sinus congestion, sinus drainage and cough. Suspect sinusitis with bronchitis. Encourage rest, fluids and supportive care. Will treat patient with oral doxycycline. Discussed photosensitivity with antibiotic. Encourage continue Claritin and Flonase.Discussed indication, risks and benefits of medications with patient.  Discussed follow up with Primary care physician this week. Discussed follow up and return parameters including no resolution or any worsening concerns. Patient verbalized understanding and agreed to plan.   ____________________________________________   FINAL CLINICAL IMPRESSION(S) / ED DIAGNOSES  Final diagnoses:  Acute maxillary sinusitis, recurrence not  specified  Bronchitis     Discharge Medication List as of 07/11/2016  9:34 AM    START taking these medications   Details  doxycycline (VIBRAMYCIN) 100 MG capsule Take 1 capsule (100 mg total) by mouth 2 (two) times daily., Starting Fri 07/11/2016, Normal        Note: This dictation was prepared with Dragon dictation along with smaller phrase technology. Any transcriptional errors that result from this process are unintentional.    Clinical Course      Marylene Land, NP 07/11/16 [phone]

## 2016-07-11 NOTE — Discharge Instructions (Signed)
Take medication as prescribed. Rest. Drink plenty of fluids.  ° °Follow up with your primary care physician this week as needed. Return to Urgent care for new or worsening concerns.  ° °

## 2016-07-13 ENCOUNTER — Telehealth: Payer: Self-pay | Admitting: Emergency Medicine

## 2016-07-13 NOTE — Telephone Encounter (Signed)
Spoke to patient to see how she was doing.  Patient states that she is felling a little better has only had two days worth of her antibiotic.  Patient was instructed to follow-up here or with her PCP if her symptoms do not improve or worsen.  Patient verbalized understanding.

## 2016-07-22 DIAGNOSIS — H6980 Other specified disorders of Eustachian tube, unspecified ear: Secondary | ICD-10-CM | POA: Diagnosis not present

## 2016-07-22 DIAGNOSIS — J324 Chronic pansinusitis: Secondary | ICD-10-CM | POA: Diagnosis not present

## 2016-08-07 DIAGNOSIS — Z961 Presence of intraocular lens: Secondary | ICD-10-CM | POA: Diagnosis not present

## 2016-08-07 DIAGNOSIS — H26493 Other secondary cataract, bilateral: Secondary | ICD-10-CM | POA: Diagnosis not present

## 2017-01-01 ENCOUNTER — Encounter: Payer: Self-pay | Admitting: Obstetrics and Gynecology

## 2017-01-01 ENCOUNTER — Ambulatory Visit (INDEPENDENT_AMBULATORY_CARE_PROVIDER_SITE_OTHER): Payer: Medicare Other | Admitting: Obstetrics and Gynecology

## 2017-01-01 VITALS — BP 160/77 | HR 98 | Ht 67.0 in | Wt 142.7 lb

## 2017-01-01 DIAGNOSIS — Z0000 Encounter for general adult medical examination without abnormal findings: Secondary | ICD-10-CM | POA: Diagnosis not present

## 2017-01-01 DIAGNOSIS — Z1231 Encounter for screening mammogram for malignant neoplasm of breast: Secondary | ICD-10-CM

## 2017-01-01 DIAGNOSIS — Z01419 Encounter for gynecological examination (general) (routine) without abnormal findings: Secondary | ICD-10-CM

## 2017-01-01 NOTE — Progress Notes (Signed)
Subjective:   Shelia Taylor is a 71 y.o. G0P0 Caucasian female here for a routine well-woman exam.  No LMP recorded. Patient is postmenopausal.    Current complaints: none PCP: Shelia Taylor       doesn't desire labs-PCP does them  Social History: Sexual: heterosexual, but abstenant  Marital Status: widowed Living situation: alone Occupation: retired, plays golf and does water aerobics couple times a week. Tobacco/alcohol: no tobacco use Illicit drugs: no history of illicit drug use  The following portions of the patient's history were reviewed and updated as appropriate: allergies, current medications, past family history, past medical history, past social history, past surgical history and problem list.  Past Medical History Past Medical History:  Diagnosis Date  . DUB (dysfunctional uterine bleeding)   . Endometrial polyp   . Osteoporosis     Past Surgical History Past Surgical History:  Procedure Laterality Date  . APPENDECTOMY    . BREAST EXCISIONAL BIOPSY Left 1970's   surgical bx  . BREAST SURGERY     Biopsy  . DILATION AND CURETTAGE OF UTERUS    . TUBAL LIGATION      Gynecologic History G0P0  No LMP recorded. Patient is postmenopausal. Contraception: post menopausal status Last Pap: 2014. Results were: normal Last mammogram: 2017. Results were: normal  Obstetric History OB History  Gravida Para Term Preterm AB Living  0            SAB TAB Ectopic Multiple Live Births                   Current Medications Current Outpatient Prescriptions on File Prior to Visit  Medication Sig Dispense Refill  . alendronate (FOSAMAX) 70 MG tablet Take 1 tablet (70 mg total) by mouth once a week. Take with a full glass of water on an empty stomach. 12 tablet 4  . Calcium Carbonate-Vitamin D (CALCIUM + D PO) Take by mouth 2 (two) times daily.      . Cholecalciferol (VITAMIN D PO) Take 1,000 Units by mouth.      . Multiple Vitamin (MULTIVITAMIN) tablet Take 1  tablet by mouth daily.      Marland Kitchen doxycycline (VIBRAMYCIN) 100 MG capsule Take 1 capsule (100 mg total) by mouth 2 (two) times daily. (Patient not taking: Reported on 01/01/2017) 20 capsule 0   No current facility-administered medications on file prior to visit.     Review of Systems Patient denies any headaches, blurred vision, shortness of breath, chest pain, abdominal pain, problems with bowel movements, urination, or intercourse.  Objective:  BP (!) 160/77   Pulse 98   Ht 5\' 7"  (1.702 m)   Wt 142 lb 11.2 oz (64.7 kg)   BMI 22.35 kg/m  Physical Exam  General:  Well developed, well nourished, no acute distress. She is alert and oriented x3. Skin:  Warm and dry Neck:  Midline trachea, no thyromegaly or nodules Cardiovascular: Regular rate and rhythm, no murmur heard Lungs:  Effort normal, all lung fields clear to auscultation bilaterally Breasts:  No dominant palpable mass, retraction, or nipple discharge Abdomen:  Soft, non tender, no hepatosplenomegaly or masses Pelvic:  External genitalia is normal in appearance.  The vagina is normal but atrophic in appearance. The cervix is bulbous, no CMT.  Thin prep pap is not done . Uterus is felt to be normal size, shape, and contour.  No adnexal masses or tenderness noted.  Extremities:  No swelling or varicosities noted Psych:  She has a  normal mood and affect  Assessment:   Healthy well-woman exam Osteoporosis   Plan:   F/U 1 year for AE, or sooner if needed Mammogram ordered  Shelia Taylor, CNM

## 2017-02-03 ENCOUNTER — Encounter: Payer: Self-pay | Admitting: Obstetrics and Gynecology

## 2017-02-03 NOTE — Addendum Note (Signed)
Addended by: Elouise Munroe on: 02/03/2017 09:31 AM   Modules accepted: Orders

## 2017-02-04 DIAGNOSIS — I10 Essential (primary) hypertension: Secondary | ICD-10-CM | POA: Diagnosis not present

## 2017-02-04 DIAGNOSIS — M810 Age-related osteoporosis without current pathological fracture: Secondary | ICD-10-CM | POA: Diagnosis not present

## 2017-02-04 DIAGNOSIS — Z1211 Encounter for screening for malignant neoplasm of colon: Secondary | ICD-10-CM | POA: Diagnosis not present

## 2017-02-04 DIAGNOSIS — Z800 Family history of malignant neoplasm of digestive organs: Secondary | ICD-10-CM | POA: Diagnosis not present

## 2017-02-04 DIAGNOSIS — Z0001 Encounter for general adult medical examination with abnormal findings: Secondary | ICD-10-CM | POA: Diagnosis not present

## 2017-03-09 ENCOUNTER — Ambulatory Visit
Admission: RE | Admit: 2017-03-09 | Discharge: 2017-03-09 | Disposition: A | Discharge status: Home / Self Care | Payer: Medicare Other | Source: Ambulatory Visit | Attending: Obstetrics and Gynecology | Admitting: Obstetrics and Gynecology

## 2017-03-09 DIAGNOSIS — Z1231 Encounter for screening mammogram for malignant neoplasm of breast: Secondary | ICD-10-CM | POA: Diagnosis not present

## 2017-03-11 DIAGNOSIS — I10 Essential (primary) hypertension: Secondary | ICD-10-CM | POA: Diagnosis not present

## 2017-03-16 ENCOUNTER — Other Ambulatory Visit: Payer: Self-pay | Admitting: Obstetrics and Gynecology

## 2017-03-20 DIAGNOSIS — L718 Other rosacea: Secondary | ICD-10-CM | POA: Diagnosis not present

## 2017-04-23 DIAGNOSIS — M810 Age-related osteoporosis without current pathological fracture: Secondary | ICD-10-CM | POA: Diagnosis not present

## 2017-04-23 DIAGNOSIS — E559 Vitamin D deficiency, unspecified: Secondary | ICD-10-CM | POA: Diagnosis not present

## 2017-04-23 DIAGNOSIS — N938 Other specified abnormal uterine and vaginal bleeding: Secondary | ICD-10-CM | POA: Diagnosis not present

## 2017-04-23 DIAGNOSIS — I10 Essential (primary) hypertension: Secondary | ICD-10-CM | POA: Diagnosis not present

## 2017-08-03 DIAGNOSIS — Z23 Encounter for immunization: Secondary | ICD-10-CM | POA: Diagnosis not present

## 2018-01-13 ENCOUNTER — Encounter: Payer: Medicare Other | Admitting: Obstetrics and Gynecology

## 2018-02-08 ENCOUNTER — Other Ambulatory Visit: Payer: Self-pay | Admitting: Internal Medicine

## 2018-02-08 DIAGNOSIS — Z0001 Encounter for general adult medical examination with abnormal findings: Secondary | ICD-10-CM | POA: Diagnosis not present

## 2018-02-08 DIAGNOSIS — M810 Age-related osteoporosis without current pathological fracture: Secondary | ICD-10-CM | POA: Diagnosis not present

## 2018-02-08 DIAGNOSIS — R0989 Other specified symptoms and signs involving the circulatory and respiratory systems: Secondary | ICD-10-CM

## 2018-02-08 DIAGNOSIS — E559 Vitamin D deficiency, unspecified: Secondary | ICD-10-CM | POA: Diagnosis not present

## 2018-02-08 DIAGNOSIS — I10 Essential (primary) hypertension: Secondary | ICD-10-CM | POA: Diagnosis not present

## 2018-02-08 DIAGNOSIS — Z800 Family history of malignant neoplasm of digestive organs: Secondary | ICD-10-CM | POA: Diagnosis not present

## 2018-02-08 DIAGNOSIS — Z1389 Encounter for screening for other disorder: Secondary | ICD-10-CM | POA: Diagnosis not present

## 2018-02-08 LAB — CBC AND DIFFERENTIAL
HCT: 44 (ref 36–46)
Hemoglobin: 14.8 (ref 12.0–16.0)
Platelets: 278 (ref 150–399)
WBC: 6.4

## 2018-02-08 LAB — BASIC METABOLIC PANEL
BUN: 15 (ref 4–21)
Creatinine: 0.9 (ref 0.5–1.1)
Glucose: 102
Potassium: 4.6 (ref 3.4–5.3)
Sodium: 138 (ref 137–147)

## 2018-02-08 LAB — HEPATIC FUNCTION PANEL
AST: 23 (ref 13–35)
Alkaline Phosphatase: 46 (ref 25–125)
Bilirubin, Total: 19

## 2018-02-08 LAB — LIPID PANEL
Cholesterol: 244 — AB (ref 0–200)
HDL: 103 — AB (ref 35–70)
LDL Cholesterol: 119
Triglycerides: 110 (ref 40–160)

## 2018-02-11 ENCOUNTER — Ambulatory Visit
Admission: RE | Admit: 2018-02-11 | Discharge: 2018-02-11 | Disposition: A | Discharge status: Home / Self Care | Payer: Medicare Other | Source: Ambulatory Visit | Attending: Internal Medicine | Admitting: Internal Medicine

## 2018-02-11 DIAGNOSIS — R0989 Other specified symptoms and signs involving the circulatory and respiratory systems: Secondary | ICD-10-CM

## 2018-02-11 DIAGNOSIS — I6523 Occlusion and stenosis of bilateral carotid arteries: Secondary | ICD-10-CM | POA: Diagnosis not present

## 2018-02-16 DIAGNOSIS — H26499 Other secondary cataract, unspecified eye: Secondary | ICD-10-CM | POA: Diagnosis not present

## 2018-02-17 ENCOUNTER — Other Ambulatory Visit: Payer: Self-pay

## 2018-02-17 DIAGNOSIS — I6522 Occlusion and stenosis of left carotid artery: Secondary | ICD-10-CM

## 2018-02-18 DIAGNOSIS — I6522 Occlusion and stenosis of left carotid artery: Secondary | ICD-10-CM | POA: Diagnosis not present

## 2018-03-15 ENCOUNTER — Ambulatory Visit (HOSPITAL_COMMUNITY)
Admission: RE | Admit: 2018-03-15 | Discharge: 2018-03-15 | Disposition: A | Discharge status: Home / Self Care | Payer: Medicare Other | Source: Ambulatory Visit | Attending: Surgery | Admitting: Surgery

## 2018-03-15 DIAGNOSIS — I6522 Occlusion and stenosis of left carotid artery: Secondary | ICD-10-CM

## 2018-03-15 DIAGNOSIS — I6523 Occlusion and stenosis of bilateral carotid arteries: Secondary | ICD-10-CM | POA: Insufficient documentation

## 2018-03-19 DIAGNOSIS — L718 Other rosacea: Secondary | ICD-10-CM | POA: Diagnosis not present

## 2018-03-19 DIAGNOSIS — L821 Other seborrheic keratosis: Secondary | ICD-10-CM | POA: Diagnosis not present

## 2018-03-22 ENCOUNTER — Encounter: Payer: Self-pay | Admitting: Surgery

## 2018-03-22 ENCOUNTER — Other Ambulatory Visit: Payer: Self-pay

## 2018-03-22 ENCOUNTER — Ambulatory Visit (INDEPENDENT_AMBULATORY_CARE_PROVIDER_SITE_OTHER): Payer: Medicare Other | Admitting: Surgery

## 2018-03-22 VITALS — BP 150/73 | HR 79 | Temp 99.0°F | Resp 16 | Ht 67.0 in | Wt 140.0 lb

## 2018-03-22 DIAGNOSIS — I6523 Occlusion and stenosis of bilateral carotid arteries: Secondary | ICD-10-CM

## 2018-03-22 NOTE — Progress Notes (Signed)
[facility] [place]  Patient name: Shelia Taylor MRN: [id number] DOB: 1946/06/26 Sex: female   REQUESTING PROVIDER:    Daryel Gerald   REASON FOR CONSULT:    Carotid stenosis  HISTORY OF PRESENT ILLNESS:   Shelia Taylor is a 72 y.o. female, who is referred today for evaluation of asymptomatic bilateral carotid stenosis, left greater than right.  The patient denies any neurologic symptoms.  Specifically she denies numbness or weakness in either extremity.  She denies slurred speech.  She denies amaurosis fugax.  She does have a family history of stroke in her father when he was in his 64s.  Both parents lived into their 16s with no other cardiovascular issues.  The patient was recently started on a statin for her cholesterol.  She has taken an aspirin in the past but stopped secondary to nosebleeds.  She is a non-smoker.  PAST MEDICAL HISTORY    Past Medical History:  Diagnosis Date  . DUB (dysfunctional uterine bleeding)   . Endometrial polyp   . Osteoporosis      FAMILY HISTORY   Family History  Problem Relation Age of Onset  . Diabetes Mother   . Hypertension Father   . Cancer Father        colon cancer  . Diabetes Sister   . Breast cancer Cousin        Paternal 1st cousin    SOCIAL HISTORY:   Social History   Socioeconomic History  . Marital status: Married    Spouse name: Not on file  . Number of children: Not on file  . Years of education: Not on file  . Highest education level: Not on file  Occupational History  . Not on file  Social Needs  . Financial resource strain: Not on file  . Food insecurity:    Worry: Not on file    Inability: Not on file  . Transportation needs:    Medical: Not on file    Non-medical: Not on file  Tobacco Use  . Smoking status: Never Smoker  . Smokeless tobacco: Never Used  Substance and Sexual Activity  . Alcohol use: Yes    Alcohol/week:  3.6 oz    Types: 6 Standard drinks or equivalent per week  . Drug use: Not on file  . Sexual activity: Never    Birth control/protection: Surgical  Lifestyle  . Physical activity:    Days per week: Not on file    Minutes per session: Not on file  . Stress: Not on file  Relationships  . Social connections:    Talks on phone: Not on file    Gets together: Not on file    Attends religious service: Not on file    Active member of club or organization: Not on file    Attends meetings of clubs or organizations: Not on file    Relationship status: Not on file  . Intimate partner violence:    Fear of current or ex partner: Not on file    Emotionally abused: Not on file    Physically abused: Not on file    Forced sexual activity: Not on file  Other Topics Concern  . Not on file  Social History Narrative  . Not on file    ALLERGIES:    No Known Allergies  CURRENT MEDICATIONS:    Current Outpatient Medications  Medication Sig Dispense Refill  . alendronate (FOSAMAX) 70 MG tablet TAKE AS DIRECTED BY PRESCRIBER  OR PACKAGE INSTRUCTIONS 12 tablet 4  . Calcium Carbonate-Vitamin D (CALCIUM + D PO) Take by mouth 2 (two) times daily.      . Cholecalciferol (VITAMIN D PO) Take 1,000 Units by mouth.      . doxycycline (VIBRAMYCIN) 100 MG capsule Take 1 capsule (100 mg total) by mouth 2 (two) times daily. (Patient not taking: Reported on 01/01/2017) 20 capsule 0  . Multiple Vitamin (MULTIVITAMIN) tablet Take 1 tablet by mouth daily.       No current facility-administered medications for this visit.     REVIEW OF SYSTEMS:   [X]  denotes positive finding, [ ]  denotes negative finding Cardiac  Comments:  Chest pain or chest pressure:    Shortness of breath upon exertion:    Short of breath when lying flat:    Irregular heart rhythm:        Vascular    Pain in calf, thigh, or hip brought on by ambulation:    Pain in feet at night that wakes you up from your sleep:     Blood clot in  your veins:    Leg swelling:         Pulmonary    Oxygen at home:    Productive cough:     Wheezing:         Neurologic    Sudden weakness in arms or legs:     Sudden numbness in arms or legs:     Sudden onset of difficulty speaking or slurred speech:    Temporary loss of vision in one eye:     Problems with dizziness:         Gastrointestinal    Blood in stool:      Vomited blood:         Genitourinary    Burning when urinating:     Blood in urine:        Psychiatric    Major depression:         Hematologic    Bleeding problems:    Problems with blood clotting too easily:        Skin    Rashes or ulcers:        Constitutional    Fever or chills:     PHYSICAL EXAM:   There were no vitals filed for this visit.  GENERAL: The patient is a well-nourished female, in no acute distress. The vital signs are documented above. CARDIAC: There is a regular rate and rhythm.  VASCULAR: Faint bilateral carotid bruits.  Palpable bilateral posterior tibial pulse PULMONARY: Nonlabored respirations ABDOMEN: Soft and non-tender.  No pulsatile mass MUSCULOSKELETAL: There are no major deformities or cyanosis. NEUROLOGIC: No focal weakness or paresthesias are detected. SKIN: There are no ulcers or rashes noted. PSYCHIATRIC: The patient has a normal affect.  STUDIES:   Outside ultrasound studies showed 50-69% right carotid stenosis and 70-99% left carotid stenosis.  The studies were repeated in our office.  Our measurements were 40-59% right carotid stenosis and 1-39% left carotid stenosis  ASSESSMENT and PLAN   Asymptomatic carotid stenosis: I discussed with the patient that because of the difference in the ultrasound findings, I feel like we need to get a confirmatory test.  I am ordering a CT angiogram of the neck to determine the true extent of her carotid stenosis.  We did discuss the importance of medical management given that she has known vascular disease in her carotid  arteries.  This would include starting a baby aspirin.  Because of her history of nosebleeds, I told her to take it every other day.  She is already on a statin.  She will discuss initiation of ACE inhibitor therapy with her primary physician when they have their meeting and July.  She is going to get her CT scan of the neck within the next week or so and follow-up with me afterwards.   Annamarie Major, MD [facility] [phone] Pager [phone]

## 2018-04-05 ENCOUNTER — Ambulatory Visit
Admission: RE | Admit: 2018-04-05 | Discharge: 2018-04-05 | Disposition: A | Discharge status: Home / Self Care | Payer: Medicare Other | Source: Ambulatory Visit | Attending: Surgery | Admitting: Surgery

## 2018-04-05 DIAGNOSIS — I6523 Occlusion and stenosis of bilateral carotid arteries: Secondary | ICD-10-CM

## 2018-04-05 DIAGNOSIS — I6522 Occlusion and stenosis of left carotid artery: Secondary | ICD-10-CM | POA: Diagnosis not present

## 2018-04-05 MED ORDER — IOPAMIDOL (ISOVUE-370) INJECTION 76%
75.0000 mL | Freq: Once | INTRAVENOUS | Status: AC | PRN
  Administered 2018-04-05: 75 mL via INTRAVENOUS

## 2018-04-20 DIAGNOSIS — I6523 Occlusion and stenosis of bilateral carotid arteries: Secondary | ICD-10-CM | POA: Diagnosis not present

## 2018-04-20 DIAGNOSIS — E7800 Pure hypercholesterolemia, unspecified: Secondary | ICD-10-CM | POA: Diagnosis not present

## 2018-04-20 DIAGNOSIS — I10 Essential (primary) hypertension: Secondary | ICD-10-CM | POA: Diagnosis not present

## 2018-04-20 DIAGNOSIS — R0989 Other specified symptoms and signs involving the circulatory and respiratory systems: Secondary | ICD-10-CM | POA: Diagnosis not present

## 2018-04-20 LAB — LIPID PANEL
Cholesterol: 183 (ref 0–200)
HDL: 103 — AB (ref 35–70)
LDL Cholesterol: 65
Triglycerides: 79 (ref 40–160)

## 2018-04-20 LAB — HEPATIC FUNCTION PANEL
ALT: 18 (ref 7–35)
AST: 22 (ref 13–35)
Bilirubin, Total: 0.6

## 2018-04-26 ENCOUNTER — Encounter: Payer: Self-pay | Admitting: Surgery

## 2018-04-26 ENCOUNTER — Other Ambulatory Visit: Payer: Self-pay

## 2018-04-26 ENCOUNTER — Ambulatory Visit (INDEPENDENT_AMBULATORY_CARE_PROVIDER_SITE_OTHER): Payer: Medicare Other | Admitting: Surgery

## 2018-04-26 VITALS — BP 138/58 | HR 83 | Resp 18 | Ht 67.0 in

## 2018-04-26 DIAGNOSIS — I6523 Occlusion and stenosis of bilateral carotid arteries: Secondary | ICD-10-CM

## 2018-04-26 NOTE — Progress Notes (Signed)
HISTORY AND PHYSICAL     CC:  Follow up CT scan Requesting Provider:  Lanice Shirts, *  HPI: This is a 72 y.o. female who presented to her PCP and carotid bruits were heard and she was sent for carotid duplex.  Outside studies revealed 70-99% on the left and 50-69% stenosis on the right.  Studies in our office revealed 1-39% left carotid artery stenosis and 40-59% carotid artery stenosis.  Given the fact these were different, she was sent for a CTA of the neck and presents today for the results.  She continues to take her aspirin and statin daily.  She is on an ACEI for blood pressure management.   Past Medical History:  Diagnosis Date  . DUB (dysfunctional uterine bleeding)   . Endometrial polyp   . Osteoporosis     Past Surgical History:  Procedure Laterality Date  . APPENDECTOMY    . BREAST EXCISIONAL BIOPSY Left 1970's   NEG  . BREAST SURGERY     Biopsy  . DILATION AND CURETTAGE OF UTERUS    . TUBAL LIGATION      No Known Allergies  Current Outpatient Medications  Medication Sig Dispense Refill  . alendronate (FOSAMAX) 70 MG tablet TAKE AS DIRECTED BY PRESCRIBER OR PACKAGE INSTRUCTIONS 12 tablet 4  . aspirin 81 MG tablet Take 81 mg by mouth daily.    . Calcium Carbonate-Vitamin D (CALCIUM + D PO) Take by mouth 2 (two) times daily.      . Multiple Vitamin (MULTIVITAMIN) tablet Take 1 tablet by mouth daily.      . rosuvastatin (CRESTOR) 10 MG tablet TAKE 1 TABLET BY MOUTH MONDAY, WEDNESDAY AND FRIDAY  1  . chlorthalidone (HYGROTON) 25 MG tablet     . Cholecalciferol (VITAMIN D PO) Take 1,000 Units by mouth.      . doxycycline (VIBRAMYCIN) 100 MG capsule Take 1 capsule (100 mg total) by mouth 2 (two) times daily. (Patient not taking: Reported on 04/26/2018) 20 capsule 0  . enalapril (VASOTEC) 2.5 MG tablet      No current facility-administered medications for this visit.     Family History  Problem Relation Age of Onset  . Diabetes Mother   . Hypertension  Father   . Cancer Father        colon cancer  . Diabetes Sister   . Breast cancer Cousin        Paternal 1st cousin    Social History   Socioeconomic History  . Marital status: Widowed    Spouse name: Not on file  . Number of children: Not on file  . Years of education: Not on file  . Highest education level: Not on file  Occupational History  . Not on file  Social Needs  . Financial resource strain: Not on file  . Food insecurity:    Worry: Not on file    Inability: Not on file  . Transportation needs:    Medical: Not on file    Non-medical: Not on file  Tobacco Use  . Smoking status: Never Smoker  . Smokeless tobacco: Never Used  Substance and Sexual Activity  . Alcohol use: Yes    Alcohol/week: 3.6 oz    Types: 6 Standard drinks or equivalent per week  . Drug use: Not on file  . Sexual activity: Never    Birth control/protection: Surgical  Lifestyle  . Physical activity:    Days per week: Not on file    Minutes per  session: Not on file  . Stress: Not on file  Relationships  . Social connections:    Talks on phone: Not on file    Gets together: Not on file    Attends religious service: Not on file    Active member of club or organization: Not on file    Attends meetings of clubs or organizations: Not on file    Relationship status: Not on file  . Intimate partner violence:    Fear of current or ex partner: Not on file    Emotionally abused: Not on file    Physically abused: Not on file    Forced sexual activity: Not on file  Other Topics Concern  . Not on file  Social History Narrative  . Not on file     REVIEW OF SYSTEMS:   [X]  denotes positive finding, [ ]  denotes negative finding Cardiac  Comments:  Chest pain or chest pressure:    Shortness of breath upon exertion:    Short of breath when lying flat:    Irregular heart rhythm:        Vascular    Pain in calf, thigh, or hip brought on by ambulation:    Pain in feet at night that wakes you  up from your sleep:     Blood clot in your veins:    Leg swelling:         Pulmonary    Oxygen at home:    Productive cough:     Wheezing:         Neurologic    Sudden weakness in arms or legs:     Sudden numbness in arms or legs:     Sudden onset of difficulty speaking or slurred speech:    Temporary loss of vision in one eye:     Problems with dizziness:         Gastrointestinal    Blood in stool:     Vomited blood:         Genitourinary    Burning when urinating:     Blood in urine:        Psychiatric    Major depression:         Hematologic    Bleeding problems:    Problems with blood clotting too easily:        Skin    Rashes or ulcers:        Constitutional    Fever or chills:      PHYSICAL EXAMINATION:  Vitals:   04/26/18 1324 04/26/18 1325  BP: 139/64 (!) 138/58  Pulse: 83   Resp: 18   SpO2: 100%    Vitals:   04/26/18 1324  Height: 5\' 7"  (1.702 m)   Body mass index is 21.93 kg/m.  General:  WDWN in NAD; vital signs documented above Gait: Not observed HENT: WNL, normocephalic Pulmonary: normal non-labored breathing , without Rales, rhonchi,  wheezing Cardiac: regular HR, without  Murmurs with carotid bruits bilaterally Abdomen: soft, NT, no masses Skin: without rashes Vascular Exam/Pulses:  Right Left  Radial 2+ (normal) 2+ (normal)  DP 2+ (normal) 2+ (normal)   Extremities: without ischemic changes, without Gangrene , without cellulitis; without open wounds;  Musculoskeletal: no muscle wasting or atrophy  Neurologic: A&O X 3;  No focal weakness or paresthesias are detected Psychiatric:  The pt has Normal affect.   Non-Invasive Vascular Imaging:   CTA head/neck 04/05/18: IMPRESSION: Minor atheromatous change at the LEFT carotid bifurcation. RIGHT frontal  bifurcation widely patent.  No significant carotid stenosis on the RIGHT or LEFT, and no significant proximal atherosclerotic change of the great vessels or vertebral  arteries.  Pt meds includes: Statin:  Yes.   Beta Blocker:  No. Aspirin:  Yes.   ACEI:  Yes.   ARB:  No. CCB use:  No Other Antiplatelet/Anticoagulant:  No    ASSESSMENT/PLAN:: 72 y.o. female with asymptomatic bilateral carotid artery stenosis   -given the difference in carotid duplex results, the pt was sent for CTA of the head/neck to evaluate carotid arteries.  The CTA revealed there is no significant carotid artery stenosis on the right or the left.  Dr. Trula Slade discussed the results with the pt and will plan on her returning in 2 years with a carotid duplex given her family hx.  She will call sooner should she have any problems.   Leontine Locket, PA-C [facility] [phone]  Clinic MD:  Pt seen and examined with Dr. Trula Slade    CT scan shows no significant bilateral carotid artery stenosis.  Because she does have some posterior plaque, I recommended repeating her carotid duplex intermittently, given her family history of stroke.  She will come back for carotid duplex in 2 years.  In the meantime she will focus on medical management as documented above.  Annamarie Major

## 2018-04-29 ENCOUNTER — Encounter: Payer: Self-pay | Admitting: Obstetrics and Gynecology

## 2018-04-29 ENCOUNTER — Ambulatory Visit (INDEPENDENT_AMBULATORY_CARE_PROVIDER_SITE_OTHER): Payer: Medicare Other | Admitting: Obstetrics and Gynecology

## 2018-04-29 VITALS — BP 163/67 | HR 64 | Ht 67.0 in | Wt 142.2 lb

## 2018-04-29 DIAGNOSIS — Z1231 Encounter for screening mammogram for malignant neoplasm of breast: Secondary | ICD-10-CM

## 2018-04-29 DIAGNOSIS — Z01419 Encounter for gynecological examination (general) (routine) without abnormal findings: Secondary | ICD-10-CM | POA: Diagnosis not present

## 2018-04-29 DIAGNOSIS — M810 Age-related osteoporosis without current pathological fracture: Secondary | ICD-10-CM

## 2018-04-29 DIAGNOSIS — Z1239 Encounter for other screening for malignant neoplasm of breast: Secondary | ICD-10-CM

## 2018-04-29 NOTE — Progress Notes (Signed)
Subjective:   Shelia Taylor is a 72 y.o. G64P0 Caucasian female here for a routine well-woman exam.  No LMP recorded. Patient is postmenopausal.    Current complaints: none PCP: Wynetta Emery       PCP does labs  Social History: Sexual: heterosexual Marital Status: widowed Living situation: alone Occupation: retired Tobacco/alcohol: no tobacco use Illicit drugs: no history of illicit drug use  The following portions of the patient's history were reviewed and updated as appropriate: allergies, current medications, past family history, past medical history, past social history, past surgical history and problem list.  Past Medical History Past Medical History:  Diagnosis Date  . DUB (dysfunctional uterine bleeding)   . Endometrial polyp   . Osteoporosis     Past Surgical History Past Surgical History:  Procedure Laterality Date  . APPENDECTOMY    . BREAST EXCISIONAL BIOPSY Left 1970's   NEG  . BREAST SURGERY     Biopsy  . DILATION AND CURETTAGE OF UTERUS    . TUBAL LIGATION      Gynecologic History G0P0  No LMP recorded. Patient is postmenopausal. Contraception: post menopausal status Last Pap: 2014. Results were: normal Last mammogram: 2018. Results were: normal Last BDS : 2017.results were: osteopenic  Obstetric History OB History  Gravida Para Term Preterm AB Living  0            SAB TAB Ectopic Multiple Live Births               Current Medications Current Outpatient Medications on File Prior to Visit  Medication Sig Dispense Refill  . alendronate (FOSAMAX) 70 MG tablet TAKE AS DIRECTED BY PRESCRIBER OR PACKAGE INSTRUCTIONS 12 tablet 4  . aspirin 81 MG tablet Take 81 mg by mouth daily.    . Calcium Carbonate-Vitamin D (CALCIUM + D PO) Take by mouth 2 (two) times daily.      . Cholecalciferol (VITAMIN D PO) Take 1,000 Units by mouth.      . enalapril (VASOTEC) 2.5 MG tablet     . Multiple Vitamin (MULTIVITAMIN) tablet Take 1 tablet by mouth daily.      .  rosuvastatin (CRESTOR) 10 MG tablet TAKE 1 TABLET BY MOUTH MONDAY, WEDNESDAY AND FRIDAY  1  . chlorthalidone (HYGROTON) 25 MG tablet     . doxycycline (VIBRAMYCIN) 100 MG capsule Take 1 capsule (100 mg total) by mouth 2 (two) times daily. (Patient not taking: Reported on 04/26/2018) 20 capsule 0   No current facility-administered medications on file prior to visit.     Review of Systems Patient denies any headaches, blurred vision, shortness of breath, chest pain, abdominal pain, problems with bowel movements, urination, or intercourse.  Objective:  BP (!) 163/67   Pulse 64   Ht 5\' 7"  (1.702 m)   Wt 142 lb 3.2 oz (64.5 kg)   BMI 22.27 kg/m  Physical Exam  General:  Well developed, well nourished, no acute distress. She is alert and oriented x3. Skin:  Warm and dry Neck:  Midline trachea, no thyromegaly or nodules Cardiovascular: Regular rate and rhythm, no murmur heard Lungs:  Effort normal, all lung fields clear to auscultation bilaterally Breasts:  No dominant palpable mass, retraction, or nipple discharge Abdomen:  Soft, non tender, no hepatosplenomegaly or masses Pelvic:  External genitalia is normal in appearance.  The vagina is normal and atrophic in appearance. The cervix is bulbous, no CMT.  Thin prep pap is not done Uterus is felt to be normal size, shape,  and contour.  No adnexal masses or tenderness noted. Extremities:  No swelling or varicosities noted Psych:  She has a normal mood and affect  Assessment:   Healthy well-woman exam Osteoporosis Elevated blood pressure Elevated cholesterol   Plan:   F/U 1 year for AE, or sooner if needed Mammogram ordered along with BDS Will consider taking a break from fosemax if BDS good, per recommendation from PCP and liturature.  Alleya Demeter Rockney Ghee, CNM

## 2018-05-25 ENCOUNTER — Ambulatory Visit
Admission: RE | Admit: 2018-05-25 | Discharge: 2018-05-25 | Disposition: A | Discharge status: Home / Self Care | Payer: Medicare Other | Source: Ambulatory Visit | Attending: Obstetrics and Gynecology | Admitting: Obstetrics and Gynecology

## 2018-05-25 DIAGNOSIS — Z1231 Encounter for screening mammogram for malignant neoplasm of breast: Secondary | ICD-10-CM | POA: Insufficient documentation

## 2018-05-25 DIAGNOSIS — Z1239 Encounter for other screening for malignant neoplasm of breast: Secondary | ICD-10-CM

## 2018-05-25 DIAGNOSIS — M810 Age-related osteoporosis without current pathological fracture: Secondary | ICD-10-CM | POA: Diagnosis not present

## 2018-05-25 DIAGNOSIS — M8589 Other specified disorders of bone density and structure, multiple sites: Secondary | ICD-10-CM | POA: Diagnosis not present

## 2018-05-25 DIAGNOSIS — Z01419 Encounter for gynecological examination (general) (routine) without abnormal findings: Secondary | ICD-10-CM | POA: Diagnosis not present

## 2018-06-15 DIAGNOSIS — I10 Essential (primary) hypertension: Secondary | ICD-10-CM | POA: Diagnosis not present

## 2018-06-15 DIAGNOSIS — M810 Age-related osteoporosis without current pathological fracture: Secondary | ICD-10-CM | POA: Diagnosis not present

## 2018-06-15 DIAGNOSIS — E7800 Pure hypercholesterolemia, unspecified: Secondary | ICD-10-CM | POA: Diagnosis not present

## 2018-08-13 DIAGNOSIS — Z23 Encounter for immunization: Secondary | ICD-10-CM | POA: Diagnosis not present

## 2019-03-18 DIAGNOSIS — L821 Other seborrheic keratosis: Secondary | ICD-10-CM | POA: Diagnosis not present

## 2019-03-18 DIAGNOSIS — L718 Other rosacea: Secondary | ICD-10-CM | POA: Diagnosis not present

## 2019-05-06 ENCOUNTER — Ambulatory Visit (INDEPENDENT_AMBULATORY_CARE_PROVIDER_SITE_OTHER): Payer: Medicare Other | Admitting: Obstetrics and Gynecology

## 2019-05-06 ENCOUNTER — Encounter: Payer: Self-pay | Admitting: Obstetrics and Gynecology

## 2019-05-06 ENCOUNTER — Other Ambulatory Visit: Payer: Self-pay

## 2019-05-06 VITALS — BP 140/74 | HR 87 | Ht 67.0 in | Wt 133.4 lb

## 2019-05-06 DIAGNOSIS — Z1231 Encounter for screening mammogram for malignant neoplasm of breast: Secondary | ICD-10-CM

## 2019-05-06 DIAGNOSIS — Z01419 Encounter for gynecological examination (general) (routine) without abnormal findings: Secondary | ICD-10-CM

## 2019-05-06 NOTE — Progress Notes (Signed)
Subjective:   Shelia Taylor is a 73 y.o. G68P0 Caucasian female here for a routine well-woman exam.  No LMP recorded. Patient is postmenopausal.    Current complaints: none PCP: Schoenhoff       PCP does labs  Social History: Sexual: heterosexual Marital Status: widowed Living situation: alone Occupation: retired Tobacco/alcohol: no tobacco use Illicit drugs: no history of illicit drug use  The following portions of the patient's history were reviewed and updated as appropriate: allergies, current medications, past family history, past medical history, past social history, past surgical history and problem list.  Past Medical History Past Medical History:  Diagnosis Date  . DUB (dysfunctional uterine bleeding)   . Endometrial polyp   . Osteoporosis     Past Surgical History Past Surgical History:  Procedure Laterality Date  . APPENDECTOMY    . BREAST EXCISIONAL BIOPSY Left 1970's   NEG  . BREAST SURGERY     Biopsy  . DILATION AND CURETTAGE OF UTERUS    . TUBAL LIGATION      Gynecologic History G0P0  No LMP recorded. Patient is postmenopausal. Contraception: abstinence Last Pap: 2014. Results were: normal Last mammogram: 05/2018. Results were: normal Last BDS 05/2018. Results osteoporosis  Obstetric History OB History  Gravida Para Term Preterm AB Living  0            SAB TAB Ectopic Multiple Live Births               Current Medications Current Outpatient Medications on File Prior to Visit  Medication Sig Dispense Refill  . aspirin 81 MG tablet Take 81 mg by mouth daily.    . Calcium Carbonate-Vitamin D (CALCIUM + D PO) Take by mouth 2 (two) times daily.      . Cholecalciferol (VITAMIN D PO) Take 1,000 Units by mouth.      . enalapril (VASOTEC) 2.5 MG tablet     . Multiple Vitamin (MULTIVITAMIN) tablet Take 1 tablet by mouth daily.      . rosuvastatin (CRESTOR) 10 MG tablet TAKE 1 TABLET BY MOUTH MONDAY, WEDNESDAY AND FRIDAY  1  . alendronate  (FOSAMAX) 70 MG tablet TAKE AS DIRECTED BY PRESCRIBER OR PACKAGE INSTRUCTIONS (Patient not taking: Reported on 05/06/2019) 12 tablet 4  . doxycycline (VIBRAMYCIN) 100 MG capsule Take 1 capsule (100 mg total) by mouth 2 (two) times daily. (Patient not taking: Reported on 04/26/2018) 20 capsule 0   No current facility-administered medications on file prior to visit.     Review of Systems Patient denies any headaches, blurred vision, shortness of breath, chest pain, abdominal pain, problems with bowel movements, urination, or intercourse.  Objective:  BP 140/74   Pulse 87   Ht 5\' 7"  (1.702 m)   Wt 133 lb 6.4 oz (60.5 kg)   BMI 20.89 kg/m  Physical Exam  General:  Well developed, well nourished, no acute distress. She is alert and oriented x3. Skin:  Warm and dry Neck:  Midline trachea, no thyromegaly or nodules Cardiovascular: Regular rate and rhythm, no murmur heard Lungs:  Effort normal, all lung fields clear to auscultation bilaterally Breasts:  No dominant palpable mass, retraction, or nipple discharge Abdomen:  Soft, non tender, no hepatosplenomegaly or masses Pelvic:  External genitalia is normal in appearance.  The vagina is normal in appearance. The cervix is bulbous, no CMT.  Thin prep pap is not done. Uterus is felt to be normal size, shape, and contour.  No adnexal masses or tenderness noted. Extremities:  No swelling or varicosities noted Psych:  She has a normal mood and affect  Assessment:   Healthy well-woman exam  Plan:   F/U 1 year for AE, or sooner if needed Mammogram ordered  Providence Stivers Rockney Ghee, CNM

## 2019-05-20 ENCOUNTER — Ambulatory Visit (INDEPENDENT_AMBULATORY_CARE_PROVIDER_SITE_OTHER): Payer: Medicare Other | Admitting: Internal Medicine

## 2019-05-20 ENCOUNTER — Encounter: Payer: Self-pay | Admitting: Internal Medicine

## 2019-05-20 ENCOUNTER — Other Ambulatory Visit: Payer: Self-pay

## 2019-05-20 VITALS — BP 138/70 | HR 75 | Ht 67.0 in | Wt 134.0 lb

## 2019-05-20 DIAGNOSIS — Z800 Family history of malignant neoplasm of digestive organs: Secondary | ICD-10-CM

## 2019-05-20 DIAGNOSIS — I6523 Occlusion and stenosis of bilateral carotid arteries: Secondary | ICD-10-CM | POA: Diagnosis not present

## 2019-05-20 DIAGNOSIS — Z0000 Encounter for general adult medical examination without abnormal findings: Secondary | ICD-10-CM | POA: Diagnosis not present

## 2019-05-20 DIAGNOSIS — E782 Mixed hyperlipidemia: Secondary | ICD-10-CM | POA: Insufficient documentation

## 2019-05-20 DIAGNOSIS — I10 Essential (primary) hypertension: Secondary | ICD-10-CM | POA: Diagnosis not present

## 2019-05-20 DIAGNOSIS — M810 Age-related osteoporosis without current pathological fracture: Secondary | ICD-10-CM | POA: Diagnosis not present

## 2019-05-20 DIAGNOSIS — Z1211 Encounter for screening for malignant neoplasm of colon: Secondary | ICD-10-CM | POA: Diagnosis not present

## 2019-05-20 NOTE — Patient Instructions (Signed)
Health Maintenance  Topic Date Due  . Hepatitis C Screening  1946/04/16  . TETANUS/TDAP  08/01/1966  . COLONOSCOPY  08/01/1997  . PNA vac Low Risk Adult (1 of 2 - PCV13) 08/01/2012  . INFLUENZA VACCINE  05/07/2019  . MAMMOGRAM  05/25/2020  . DEXA SCAN  Completed  . PAP SMEAR-Modifier  Discontinued

## 2019-05-20 NOTE — Progress Notes (Signed)
Patient: Shelia Taylor, Female    DOB: 1946-05-25, 73 y.o.   MRN: [id number] Visit Date: 05/20/2019  Today's Provider: Halina Maidens, MD   Chief Complaint  Patient presents with  . Establish Care    PCP retired.    Subjective:    Annual wellness visit Shelia Taylor is a 73 y.o. female who presents today for her Subsequent Annual Wellness Visit. She feels well. She reports exercising regularly playing golf three days per week and water aerobics three days a week at the [facility]. She reports she is sleeping fairly well. She denies breast issues - GYN exam is done yearly.  Mammogram 05/2018 DEXA  05/2018 Colonoscopy - 2011 - repeat 5 years Prevnar-13 01/2015 ----------------------------------------------------------- Hypertension This is a chronic problem. Episode onset: about 3 years ago. The problem is controlled. Pertinent negatives include no chest pain, headaches, palpitations or shortness of breath. Risk factors for coronary artery disease include dyslipidemia. There is no history of kidney disease, CAD/MI, CVA or PVD.  Hyperlipidemia This is a chronic problem. The problem is controlled. Pertinent negatives include no chest pain or shortness of breath. Current antihyperlipidemic treatment includes statins. Risk factors for coronary artery disease include dyslipidemia (mild CAS).  CAS - now being followed by Vascular surgery.  Repeat US was not as stenotic as the initial study.  She has no visual sx, hx of TIA or focal neurological deficit, no headache, chest pain. She was started on Crestor three days a week within the last year and has not yet had follow up labs.  Review of Systems  Constitutional: Negative for chills, fatigue and fever.  HENT: Negative for congestion, hearing loss, tinnitus, trouble swallowing and voice change.   Eyes: Negative for visual disturbance.  Respiratory: Negative for cough, chest tightness, shortness of breath and wheezing.    Cardiovascular: Negative for chest pain, palpitations and leg swelling.  Gastrointestinal: Negative for abdominal pain, constipation, diarrhea and vomiting.  Endocrine: Negative for polydipsia and polyuria.  Genitourinary: Negative for dysuria, frequency, genital sores, vaginal bleeding and vaginal discharge.  Musculoskeletal: Negative for arthralgias, gait problem and joint swelling.  Skin: Negative for color change and rash.  Neurological: Negative for dizziness, tremors, light-headedness and headaches.  Hematological: Negative for adenopathy. Does not bruise/bleed easily.  Psychiatric/Behavioral: Negative for dysphoric mood and sleep disturbance. The patient is not nervous/anxious.     Social History   Socioeconomic History  . Marital status: Widowed    Spouse name: Not on file  . Number of children: 0  . Years of education: Not on file  . Highest education level: Not on file  Occupational History  . Not on file  Social Needs  . Financial resource strain: Not on file  . Food insecurity    Worry: Not on file    Inability: Not on file  . Transportation needs    Medical: Not on file    Non-medical: Not on file  Tobacco Use  . Smoking status: Never Smoker  . Smokeless tobacco: Never Used  Substance and Sexual Activity  . Alcohol use: Yes    Alcohol/week: 8.0 standard drinks    Types: 8 Standard drinks or equivalent per week  . Drug use: Never  . Sexual activity: Never    Birth control/protection: Surgical  Lifestyle  . Physical activity    Days per week: 6 days    Minutes per session: 60 min  . Stress: Not on file  Relationships  . Social connections    Talks  on phone: Not on file    Gets together: Not on file    Attends religious service: Not on file    Active member of club or organization: Not on file    Attends meetings of clubs or organizations: Not on file    Relationship status: Not on file  . Intimate partner violence    Fear of current or ex partner: Not  on file    Emotionally abused: Not on file    Physically abused: Not on file    Forced sexual activity: Not on file  Other Topics Concern  . Not on file  Social History Narrative  . Not on file    Patient Active Problem List   Diagnosis Date Noted  . Essential hypertension 05/20/2019  . Mixed hyperlipidemia 05/20/2019  . Bilateral carotid artery stenosis 05/20/2019  . Colon cancer screening 05/20/2019  . Family history of colon cancer 05/20/2019  . Endometrial polyp   . Osteoporosis of multiple sites   . DUB (dysfunctional uterine bleeding)     Past Surgical History:  Procedure Laterality Date  . APPENDECTOMY    . BREAST EXCISIONAL BIOPSY Left 1970's   NEG  . CATARACT EXTRACTION, BILATERAL  2017  . DILATION AND CURETTAGE OF UTERUS    . TUBAL LIGATION      Her family history includes Breast cancer in her cousin; Cancer in her father; Diabetes in her mother and sister; Hypertension in her father.     Current Meds  Medication Sig  . aspirin 81 MG tablet Take 81 mg by mouth daily.  . Calcium Carbonate-Vitamin D (CALCIUM + D PO) Take by mouth 2 (two) times daily.    . enalapril (VASOTEC) 2.5 MG tablet Take 2.5 mg by mouth daily.   . Multiple Vitamin (MULTIVITAMIN) tablet Take 1 tablet by mouth daily.    . rosuvastatin (CRESTOR) 10 MG tablet TAKE 1 TABLET BY MOUTH MONDAY, [place]    Patient Care Team: Glean Hess, MD as PCP - General (Internal Medicine) Joylene Igo, CNM as Midwife (Obstetrics and Gynecology) Serafina Mitchell, MD as Consulting Physician (Vascular Surgery)       Objective:   Wt Readings from Last 3 Encounters:  05/20/19 134 lb (60.8 kg)  05/06/19 133 lb 6.4 oz (60.5 kg)  04/29/18 142 lb 3.2 oz (64.5 kg)    Vitals: BP 138/70   Pulse 75   Ht 5\' 7"  (1.702 m)   Wt 134 lb (60.8 kg)   SpO2 100%   BMI 20.99 kg/m   Physical Exam Vitals signs and nursing note reviewed.  Constitutional:      General: She is not in acute  distress.    Appearance: She is well-developed.  HENT:     Head: Normocephalic and atraumatic.     Right Ear: Tympanic membrane and ear canal normal.     Left Ear: Tympanic membrane and ear canal normal.     Nose:     Right Sinus: No maxillary sinus tenderness.     Left Sinus: No maxillary sinus tenderness.     Mouth/Throat:     Pharynx: Uvula midline.  Eyes:     General: No scleral icterus.       Right eye: No discharge.        Left eye: No discharge.     Conjunctiva/sclera: Conjunctivae normal.  Neck:     Musculoskeletal: Normal range of motion. No erythema.     Thyroid: No thyromegaly.  Vascular: Carotid bruit (faint right bruit) present.  Cardiovascular:     Rate and Rhythm: Normal rate and regular rhythm.     Pulses: Normal pulses.     Heart sounds: Normal heart sounds.  Pulmonary:     Effort: Pulmonary effort is normal. No respiratory distress.     Breath sounds: No wheezing.  Abdominal:     General: Abdomen is flat. Bowel sounds are normal. There is no distension.     Palpations: Abdomen is soft.     Tenderness: There is no abdominal tenderness. There is no right CVA tenderness or left CVA tenderness.     Hernia: No hernia is present.  Musculoskeletal: Normal range of motion.  Lymphadenopathy:     Cervical: No cervical adenopathy.  Skin:    General: Skin is warm and dry.     Findings: No rash.  Neurological:     General: No focal deficit present.     Mental Status: She is alert and oriented to person, place, and time.     Cranial Nerves: No cranial nerve deficit.     Sensory: No sensory deficit.     Motor: Motor function is intact.     Gait: Gait is intact.     Deep Tendon Reflexes: Reflexes are normal and symmetric.     Reflex Scores:      Bicep reflexes are 2+ on the right side and 2+ on the left side.      Patellar reflexes are 2+ on the right side and 2+ on the left side. Psychiatric:        Attention and Perception: Attention normal.        Mood and  Affect: Mood normal.        Speech: Speech normal.        Behavior: Behavior normal.        Thought Content: Thought content normal.        Cognition and Memory: Cognition normal.     Activities of Daily Living In your present state of health, do you have any difficulty performing the following activities: 05/20/2019 05/20/2019  Hearing? N N  Vision? N N  Comment wears glasses and contacts -  Difficulty concentrating or making decisions? N N  Walking or climbing stairs? N N  Dressing or bathing? N N  Doing errands, shopping? N N  Some recent data might be hidden    Fall Risk Assessment Fall Risk  05/20/2019  Falls in the past year? 0  Number falls in past yr: 0  Injury with Fall? 0  Follow up Falls evaluation completed     Depression Screen PHQ 2/9 Scores 05/20/2019  PHQ - 2 Score 0    6CIT Screen 05/20/2019  What Year? 0 points  What month? 0 points  What time? 0 points  Count back from 20 0 points  Months in reverse 0 points  Repeat phrase 0 points  Total Score 0    Medicare Annual Wellness Visit Summary:  Reviewed patient's Family Medical History Reviewed and updated list of patient's medical providers Assessment of cognitive impairment was done Assessed patient's functional ability Established a written schedule for health screening [place] Completed and Reviewed  Exercise Activities and Dietary recommendations Goals   None     Immunization History  Administered Date(s) Administered  . Influenza, High Dose Seasonal PF 08/13/2018  . Pneumococcal Conjugate-13 01/31/2015    Health Maintenance  Topic Date Due  . Hepatitis C Screening  11/07/1946  .  TETANUS/TDAP  08/01/1966  . COLONOSCOPY  08/01/1997  . PNA vac Low Risk Adult (1 of 2 - PCV13) 08/01/2012  . INFLUENZA VACCINE  05/07/2019  . MAMMOGRAM  05/25/2020  . DEXA SCAN  Completed  . PAP SMEAR-Modifier  Discontinued    Discussed health benefits of physical activity, and  encouraged her to engage in regular exercise appropriate for her age and condition.    ------------------------------------------------------------------------------------------------------------  Assessment & Plan:  1. Medicare annual wellness visit, subsequent Measures satisfied Mammogram scheduled Will get immunizations records from previous PCP  2. Essential hypertension Clinically stable and well controlled with no side effects to medication and no chest pain, SOB, headache or dizziness Continue current low dose ACEI - CBC with Differential/Platelet - TSH + free T4  3. Mixed hyperlipidemia Started on Crestor recently due to CAS; tolerating tiw well without myalgias or arthralgias Will check labs and advise - goal LDL unclear and previous levels are not available to review today - Comprehensive metabolic panel - Lipid panel  4. Bilateral carotid artery stenosis Clinically asx Continue annual [place] ordered by [facility]  5. Colon cancer screening Overdue for 5 year colonoscopy due to family hx colon cancer - currently denies change in bowel habits, bleeding, unintended weight loss She desires a local GI rather than the previous MD in [facility] - Ambulatory referral to Gastroenterology  6. Family history of colon cancer - Ambulatory referral to Gastroenterology  7. Osteoporosis of multiple sites Previously on Fosamax but pt stopped on her own She has no hx of fracture, continues on vitamin D and calcium daily She may have hx of borderline low Vitamin D Will continue to get DEXA every 2 years   Partially dictated using Editor, commissioning. Any errors are unintentional.  Halina Maidens, MD [facility]  05/20/2019

## 2019-05-20 NOTE — Progress Notes (Deleted)
    Date:  05/20/2019   Name:  Shelia Taylor   DOB:  01/22/1946   MRN:  [id number]   Chief Complaint: Establish Care (PCP retired. )  HPI  Review of Systems  Patient Active Problem List   Diagnosis Date Noted  . Endometrial polyp   . Osteoporosis   . DUB (dysfunctional uterine bleeding)     No Known Allergies  Past Surgical History:  Procedure Laterality Date  . APPENDECTOMY    . BREAST EXCISIONAL BIOPSY Left 1970's   NEG  . BREAST SURGERY     Biopsy  . CATARACT EXTRACTION, BILATERAL  2017  . DILATION AND CURETTAGE OF UTERUS    . TUBAL LIGATION      Social History   Tobacco Use  . Smoking status: Never Smoker  . Smokeless tobacco: Never Used  Substance Use Topics  . Alcohol use: Yes    Alcohol/week: 8.0 standard drinks    Types: 8 Standard drinks or equivalent per week  . Drug use: Never     Medication list has been reviewed and updated.  Current Meds  Medication Sig  . aspirin 81 MG tablet Take 81 mg by mouth daily.  . Calcium Carbonate-Vitamin D (CALCIUM + D PO) Take by mouth 2 (two) times daily.    . enalapril (VASOTEC) 2.5 MG tablet Take 2.5 mg by mouth daily.   . Multiple Vitamin (MULTIVITAMIN) tablet Take 1 tablet by mouth daily.    . rosuvastatin (CRESTOR) 10 MG tablet TAKE 1 TABLET BY MOUTH MONDAY, [facility] AND FRIDAY    PHQ 2/9 Scores 05/20/2019  PHQ - 2 Score 0    BP Readings from Last 3 Encounters:  05/20/19 (!) 146/66  05/06/19 140/74  04/29/18 (!) 163/67    Physical Exam  Wt Readings from Last 3 Encounters:  05/20/19 134 lb (60.8 kg)  05/06/19 133 lb 6.4 oz (60.5 kg)  04/29/18 142 lb 3.2 oz (64.5 kg)    BP (!) 146/66   Pulse 75   Ht 5\' 7"  (1.702 m)   Wt 134 lb (60.8 kg)   SpO2 100%   BMI 20.99 kg/m   Assessment and Plan:

## 2019-05-21 LAB — CBC WITH DIFFERENTIAL/PLATELET
Basophils Absolute: 0 10*3/uL (ref 0.0–0.2)
Basos: 1 %
EOS (ABSOLUTE): 0.1 10*3/uL (ref 0.0–0.4)
Eos: 1 %
Hematocrit: 41.5 % (ref 34.0–46.6)
Hemoglobin: 13.8 g/dL (ref 11.1–15.9)
Immature Grans (Abs): 0 10*3/uL (ref 0.0–0.1)
Immature Granulocytes: 0 %
Lymphocytes Absolute: 1.4 10*3/uL (ref 0.7–3.1)
Lymphs: 22 %
MCH: 32.2 pg (ref 26.6–33.0)
MCHC: 33.3 g/dL (ref 31.5–35.7)
MCV: 97 fL (ref 79–97)
Monocytes Absolute: 0.7 10*3/uL (ref 0.1–0.9)
Monocytes: 12 %
Neutrophils Absolute: 4 10*3/uL (ref 1.4–7.0)
Neutrophils: 64 %
Platelets: 276 10*3/uL (ref 150–450)
RBC: 4.29 x10E6/uL (ref 3.77–5.28)
RDW: 12.5 % (ref 11.7–15.4)
WBC: 6.2 10*3/uL (ref 3.4–10.8)

## 2019-05-21 LAB — COMPREHENSIVE METABOLIC PANEL
ALT: 23 IU/L (ref 0–32)
AST: 30 IU/L (ref 0–40)
Albumin/Globulin Ratio: 2.1 (ref 1.2–2.2)
Albumin: 5.1 g/dL — ABNORMAL HIGH (ref 3.7–4.7)
Alkaline Phosphatase: 52 IU/L (ref 39–117)
BUN/Creatinine Ratio: 18 (ref 12–28)
BUN: 19 mg/dL (ref 8–27)
Bilirubin Total: 0.5 mg/dL (ref 0.0–1.2)
CO2: 22 mmol/L (ref 20–29)
Calcium: 9.9 mg/dL (ref 8.7–10.3)
Chloride: 101 mmol/L (ref 96–106)
Creatinine, Ser: 1.06 mg/dL — ABNORMAL HIGH (ref 0.57–1.00)
GFR calc Af Amer: 61 mL/min/{1.73_m2} (ref >59)
GFR calc non Af Amer: 53 mL/min/{1.73_m2} — ABNORMAL LOW (ref >59)
Globulin, Total: 2.4 g/dL (ref 1.5–4.5)
Glucose: 99 mg/dL (ref 65–99)
Potassium: 4.6 mmol/L (ref 3.5–5.2)
Sodium: 140 mmol/L (ref 134–144)
Total Protein: 7.5 g/dL (ref 6.0–8.5)

## 2019-05-21 LAB — LIPID PANEL
Chol/HDL Ratio: 1.5 ratio (ref 0.0–4.4)
Cholesterol, Total: 193 mg/dL (ref 100–199)
HDL: 126 mg/dL (ref >39)
LDL Calculated: 57 mg/dL (ref 0–99)
Triglycerides: 48 mg/dL (ref 0–149)
VLDL Cholesterol Cal: 10 mg/dL (ref 5–40)

## 2019-05-21 LAB — TSH+FREE T4
Free T4: 1.21 ng/dL (ref 0.82–1.77)
TSH: 1.11 u[IU]/mL (ref 0.450–4.500)

## 2019-05-25 ENCOUNTER — Other Ambulatory Visit: Payer: Self-pay

## 2019-06-09 ENCOUNTER — Telehealth: Payer: Self-pay

## 2019-06-09 ENCOUNTER — Other Ambulatory Visit: Payer: Self-pay

## 2019-06-09 DIAGNOSIS — Z1211 Encounter for screening for malignant neoplasm of colon: Secondary | ICD-10-CM

## 2019-06-09 DIAGNOSIS — Z800 Family history of malignant neoplasm of digestive organs: Secondary | ICD-10-CM

## 2019-06-09 MED ORDER — NA SULFATE-K SULFATE-MG SULF 17.5-3.13-1.6 GM/177ML PO SOLN: 1.0000 | Freq: Once | ORAL | 0 refills | Status: AC

## 2019-06-09 NOTE — Telephone Encounter (Signed)
Gastroenterology Pre-Procedure Review  Request Date: 07/11/2019 Requesting Physician: Dr. Allen Norris  PATIENT REVIEW QUESTIONS: The patient responded to the following health history questions as indicated:    1. Are you having any GI issues? no 2. Do you have a personal history of Polyps? no 3. Do you have a family history of Colon Cancer or Polyps? yes (Dad colon cancer) 4. Diabetes Mellitus? no 5. Joint replacements in the past 12 months?no 6. Major health problems in the past 3 months?no 7. Any artificial heart valves, MVP, or defibrillator?no    MEDICATIONS & ALLERGIES:    Patient reports the following regarding taking any anticoagulation/antiplatelet therapy:   Plavix, Coumadin, Eliquis, Xarelto, Lovenox, Pradaxa, Brilinta, or Effient? no Aspirin? yes (81mg  every other day)  Patient confirms/reports the following medications:  Current Outpatient Medications  Medication Sig Dispense Refill  . aspirin 81 MG tablet Take 81 mg by mouth daily.    . Calcium Carbonate-Vitamin D (CALCIUM + D PO) Take by mouth 2 (two) times daily.      . enalapril (VASOTEC) 2.5 MG tablet Take 2.5 mg by mouth daily.     . Multiple Vitamin (MULTIVITAMIN) tablet Take 1 tablet by mouth daily.      . rosuvastatin (CRESTOR) 10 MG tablet TAKE 1 TABLET BY MOUTH MONDAY, WEDNESDAY AND FRIDAY  1   No current facility-administered medications for this visit.     Patient confirms/reports the following allergies:  No Known Allergies  No orders of the defined types were placed in this encounter.   AUTHORIZATION INFORMATION Primary Insurance: 1D#: Group #:  Secondary Insurance: 1D#: Group #:  SCHEDULE INFORMATION: Date: 07/11/2019  Time: Location: [place]

## 2019-06-16 ENCOUNTER — Ambulatory Visit
Admission: RE | Admit: 2019-06-16 | Discharge: 2019-06-16 | Disposition: A | Discharge status: Home / Self Care | Payer: Medicare Other | Source: Ambulatory Visit | Attending: Obstetrics and Gynecology | Admitting: Obstetrics and Gynecology

## 2019-06-16 DIAGNOSIS — Z1231 Encounter for screening mammogram for malignant neoplasm of breast: Secondary | ICD-10-CM | POA: Diagnosis not present

## 2019-07-05 ENCOUNTER — Other Ambulatory Visit: Payer: Self-pay

## 2019-07-05 ENCOUNTER — Encounter: Payer: Self-pay | Admitting: *Deleted

## 2019-07-07 ENCOUNTER — Other Ambulatory Visit
Admission: RE | Admit: 2019-07-07 | Discharge: 2019-07-07 | Disposition: A | Discharge status: Home / Self Care | Payer: Medicare Other | Source: Ambulatory Visit | Attending: Gastroenterology | Admitting: Gastroenterology

## 2019-07-07 ENCOUNTER — Other Ambulatory Visit: Payer: Self-pay

## 2019-07-07 DIAGNOSIS — Z20828 Contact with and (suspected) exposure to other viral communicable diseases: Secondary | ICD-10-CM | POA: Diagnosis not present

## 2019-07-08 LAB — SARS CORONAVIRUS 2 (TAT 6-24 HRS): SARS Coronavirus 2: NEGATIVE

## 2019-07-08 NOTE — Discharge Instructions (Signed)

## 2019-07-11 ENCOUNTER — Ambulatory Visit: Payer: Medicare Other | Admitting: Anesthesiology

## 2019-07-11 ENCOUNTER — Encounter
Admission: RE | Disposition: A | Discharge status: Home / Self Care | Payer: Self-pay | Source: Ambulatory Visit | Attending: Gastroenterology

## 2019-07-11 ENCOUNTER — Emergency Department
Admission: EM | Admit: 2019-07-11 | Discharge: 2019-07-11 | Disposition: A | Discharge status: Home / Self Care | Payer: Medicare Other | Source: Home / Self Care | Attending: Emergency Medicine | Admitting: Emergency Medicine

## 2019-07-11 ENCOUNTER — Encounter: Payer: Self-pay | Admitting: Emergency Medicine

## 2019-07-11 ENCOUNTER — Emergency Department: Payer: Medicare Other

## 2019-07-11 ENCOUNTER — Other Ambulatory Visit: Payer: Self-pay

## 2019-07-11 ENCOUNTER — Ambulatory Visit
Admission: RE | Admit: 2019-07-11 | Discharge: 2019-07-11 | Disposition: A | Discharge status: Home / Self Care | Payer: Medicare Other | Source: Ambulatory Visit | Attending: Gastroenterology | Admitting: Gastroenterology

## 2019-07-11 DIAGNOSIS — Z79899 Other long term (current) drug therapy: Secondary | ICD-10-CM | POA: Insufficient documentation

## 2019-07-11 DIAGNOSIS — Z7982 Long term (current) use of aspirin: Secondary | ICD-10-CM | POA: Insufficient documentation

## 2019-07-11 DIAGNOSIS — Z1211 Encounter for screening for malignant neoplasm of colon: Secondary | ICD-10-CM | POA: Insufficient documentation

## 2019-07-11 DIAGNOSIS — T8859XA Other complications of anesthesia, initial encounter: Secondary | ICD-10-CM

## 2019-07-11 DIAGNOSIS — Z9841 Cataract extraction status, right eye: Secondary | ICD-10-CM | POA: Insufficient documentation

## 2019-07-11 DIAGNOSIS — Y69 Unspecified misadventure during surgical and medical care: Secondary | ICD-10-CM | POA: Insufficient documentation

## 2019-07-11 DIAGNOSIS — T4145XA Adverse effect of unspecified anesthetic, initial encounter: Secondary | ICD-10-CM

## 2019-07-11 DIAGNOSIS — R569 Unspecified convulsions: Secondary | ICD-10-CM | POA: Diagnosis not present

## 2019-07-11 DIAGNOSIS — I10 Essential (primary) hypertension: Secondary | ICD-10-CM | POA: Insufficient documentation

## 2019-07-11 DIAGNOSIS — Z800 Family history of malignant neoplasm of digestive organs: Secondary | ICD-10-CM

## 2019-07-11 DIAGNOSIS — T887XXA Unspecified adverse effect of drug or medicament, initial encounter: Secondary | ICD-10-CM | POA: Insufficient documentation

## 2019-07-11 DIAGNOSIS — Z9842 Cataract extraction status, left eye: Secondary | ICD-10-CM | POA: Diagnosis not present

## 2019-07-11 DIAGNOSIS — J8410 Pulmonary fibrosis, unspecified: Secondary | ICD-10-CM | POA: Diagnosis not present

## 2019-07-11 DIAGNOSIS — Z5309 Procedure and treatment not carried out because of other contraindication: Secondary | ICD-10-CM | POA: Insufficient documentation

## 2019-07-11 HISTORY — DX: Essential (primary) hypertension: I10

## 2019-07-11 HISTORY — DX: Motion sickness, initial encounter: T75.3XXA

## 2019-07-11 LAB — BASIC METABOLIC PANEL
Anion gap: 12 (ref 5–15)
BUN: 19 mg/dL (ref 8–23)
CO2: 22 mmol/L (ref 22–32)
Calcium: 9.9 mg/dL (ref 8.9–10.3)
Chloride: 107 mmol/L (ref 98–111)
Creatinine, Ser: 0.99 mg/dL (ref 0.44–1.00)
GFR calc Af Amer: >60 mL/min (ref >60)
GFR calc non Af Amer: 57 mL/min — ABNORMAL LOW (ref >60)
Glucose, Bld: 103 mg/dL — ABNORMAL HIGH (ref 70–99)
Potassium: 4 mmol/L (ref 3.5–5.1)
Sodium: 141 mmol/L (ref 135–145)

## 2019-07-11 LAB — CBC WITH DIFFERENTIAL/PLATELET
Abs Immature Granulocytes: 0.02 10*3/uL (ref 0.00–0.07)
Basophils Absolute: 0 10*3/uL (ref 0.0–0.1)
Basophils Relative: 1 %
Eosinophils Absolute: 0 10*3/uL (ref 0.0–0.5)
Eosinophils Relative: 1 %
HCT: 43.5 % (ref 36.0–46.0)
Hemoglobin: 14.4 g/dL (ref 12.0–15.0)
Immature Granulocytes: 0 %
Lymphocytes Relative: 20 %
Lymphs Abs: 1 10*3/uL (ref 0.7–4.0)
MCH: 32.4 pg (ref 26.0–34.0)
MCHC: 33.1 g/dL (ref 30.0–36.0)
MCV: 98 fL (ref 80.0–100.0)
Monocytes Absolute: 0.6 10*3/uL (ref 0.1–1.0)
Monocytes Relative: 12 %
Neutro Abs: 3.4 10*3/uL (ref 1.7–7.7)
Neutrophils Relative %: 66 %
Platelets: 280 10*3/uL (ref 150–400)
RBC: 4.44 MIL/uL (ref 3.87–5.11)
RDW: 13.3 % (ref 11.5–15.5)
WBC: 5.1 10*3/uL (ref 4.0–10.5)
nRBC: 0 % (ref 0.0–0.2)

## 2019-07-11 LAB — TROPONIN I (HIGH SENSITIVITY): Troponin I (High Sensitivity): 7 ng/L (ref <18)

## 2019-07-11 SURGERY — CANCELLED PROCEDURE
Anesthesia: General

## 2019-07-11 MED ORDER — LACTATED RINGERS IV BOLUS
1000.0000 mL | Freq: Once | INTRAVENOUS | Status: AC
  Administered 2019-07-11: 1000 mL via INTRAVENOUS

## 2019-07-11 MED ORDER — IOHEXOL 350 MG/ML SOLN
75.0000 mL | Freq: Once | INTRAVENOUS | Status: AC | PRN
  Administered 2019-07-11: 75 mL via INTRAVENOUS

## 2019-07-11 MED ORDER — MIDAZOLAM HCL 2 MG/2ML IJ SOLN
INTRAMUSCULAR | Status: DC | PRN
  Administered 2019-07-11: 2 mg via INTRAVENOUS

## 2019-07-11 MED ORDER — LIDOCAINE HCL (CARDIAC) PF 100 MG/5ML IV SOSY
PREFILLED_SYRINGE | INTRAVENOUS | Status: DC | PRN
  Administered 2019-07-11: 30 mg via INTRAVENOUS

## 2019-07-11 MED ORDER — LACTATED RINGERS IV SOLN
10.0000 mL/h | INTRAVENOUS | Status: DC
  Administered 2019-07-11: 10 mL/h via INTRAVENOUS

## 2019-07-11 MED ORDER — SODIUM CHLORIDE 0.9 % IV SOLN: INTRAVENOUS | Status: DC

## 2019-07-11 MED ORDER — PROPOFOL 10 MG/ML IV BOLUS
INTRAVENOUS | Status: DC | PRN
  Administered 2019-07-11: 80 mg via INTRAVENOUS

## 2019-07-11 SURGICAL SUPPLY — 24 items

## 2019-07-11 NOTE — ED Notes (Signed)
Patient transported to CT 

## 2019-07-11 NOTE — Anesthesia Postprocedure Evaluation (Signed)
Anesthesia Post Note  Patient: Shelia Taylor  Procedure(s) Performed: COLONOSCOPY WITH PROPOFOL (N/A )  Patient location during evaluation: PACU Anesthesia Type: General Level of consciousness: awake and alert Pain management: pain level controlled Vital Signs Assessment: post-procedure vital signs reviewed and stable Respiratory status: spontaneous breathing, nonlabored ventilation, respiratory function stable and patient connected to nasal cannula oxygen Cardiovascular status: blood pressure returned to baseline and stable Postop Assessment: no apparent nausea or vomiting Anesthetic complications: yes Anesthetic complication details: anesthesia complicationsComments: See Quick Note in intraop for details.  Patient with suspected grand mal seizure shortly after induction.  No known seizure history.  Decision made to cancel procedure and transfer patient to [facility] for further evaluation and management.      Alisa Graff

## 2019-07-11 NOTE — Progress Notes (Signed)
The patient was brought back for the procedure and the sedation was started with Propofol. The patient started to have shaking and postering.  The pulse ox and was above 86 the entire time and the blood pressure and pulse increased. The patient was given Versed 2 mg and the tremors stopped.  The procedure was terminated due to concerns about the cause of the patients reaction.

## 2019-07-11 NOTE — Progress Notes (Signed)
Pt being transferred to [facility] ED for evaluation of possible seizure which occurred while in the OR during induction of anesthesia. Pt has been A & O x 4 since arrival to PACU. VSS. Remains NPO. Handoff given to EMS

## 2019-07-11 NOTE — ED Triage Notes (Addendum)
Pt arrived via [facility], came from the surgical center where she was supposed to have colonoscopy. Surgical center administered propofol and pt started to have seizure like episodes. Surgical center administered 2 versed and episode ended. CBG 111 at surgical center. Pt has no hx of seizures.

## 2019-07-11 NOTE — ED Provider Notes (Signed)
[facility] Emergency Department Provider Note   ____________________________________________   First MD Initiated Contact with Patient 07/11/19 1208     (approximate)  I have reviewed the triage vital signs and the nursing notes.   HISTORY  Chief Complaint Seizures    HPI Shelia Taylor is a 73 y.o. female with past medical history of hypertension presents to the ED following seizure-like episode.  Patient was undergoing outpatient colonoscopy, when shortly after being administered a dose of propofol she was noted to have what appeared to be tonic-clonic seizure activity.  This reportedly lasted for about 1 to 2 minutes with generalized shaking and posturing, stopped with administration of 2 mg of Versed.  Patient states she did not bite her tongue or urinate on herself, denies any period of confusion after the episode.  She denies any history of seizures, has been feeling well throughout the day today and denies similar reactions to anesthesia in the past.        Past Medical History:  Diagnosis Date  . DUB (dysfunctional uterine bleeding)   . DUB (dysfunctional uterine bleeding)   . Endometrial polyp   . Hypertension   . Motion sickness    back seat cars  . Osteoporosis     Patient Active Problem List   Diagnosis Date Noted  . Essential hypertension 05/20/2019  . Mixed hyperlipidemia 05/20/2019  . Bilateral carotid artery stenosis 05/20/2019  . Colon cancer screening 05/20/2019  . Family history of colon cancer 05/20/2019  . Endometrial polyp   . Osteoporosis of multiple sites     Past Surgical History:  Procedure Laterality Date  . APPENDECTOMY    . BREAST EXCISIONAL BIOPSY Left 1970's   NEG  . CATARACT EXTRACTION, BILATERAL  2017  . DILATION AND CURETTAGE OF UTERUS    . TUBAL LIGATION      Prior to Admission medications   Medication Sig Start Date End Date Taking? Authorizing Provider  aspirin 81 MG tablet Take 81 mg by  mouth daily.    [provider]  Calcium Carbonate-Vitamin D (CALCIUM + D PO) Take by mouth 2 (two) times daily.      [provider]  enalapril (VASOTEC) 2.5 MG tablet Take 2.5 mg by mouth daily.  04/20/18   [provider]  Multiple Vitamin (MULTIVITAMIN) tablet Take 1 tablet by mouth daily.      [provider]  rosuvastatin (CRESTOR) 10 MG tablet TAKE 1 TABLET BY MOUTH MONDAY, [facility] AND FRIDAY 02/18/18   [provider]    Allergies Patient has no known allergies.  Family History  Problem Relation Age of Onset  . Diabetes Mother   . Hypertension Father   . Cancer Father        colon cancer  . Diabetes Sister   . Breast cancer Cousin        Paternal 1st cousin    Social History Social History   Tobacco Use  . Smoking status: Never Smoker  . Smokeless tobacco: Never Used  Substance Use Topics  . Alcohol use: Yes    Alcohol/week: 10.0 standard drinks    Types: 10 Glasses of wine per week  . Drug use: Never    Review of Systems  Constitutional: No fever/chills Eyes: No visual changes. ENT: No sore throat. Cardiovascular: Denies chest pain. Respiratory: Denies shortness of breath. Gastrointestinal: No abdominal pain.  No nausea, no vomiting.  No diarrhea.  No constipation. Genitourinary: Negative for dysuria. Musculoskeletal: Negative for  back pain. Skin: Negative for rash. Neurological: Negative for headaches, focal weakness or numbness.  Positive for seizure-like activity.  ____________________________________________   PHYSICAL EXAM:  VITAL SIGNS: ED Triage Vitals  Enc Vitals Group     BP      Pulse      Resp      Temp      Temp src      SpO2      Weight      Height      Head Circumference      Peak Flow      Pain Score      Pain Loc      Pain Edu?      Excl. in [place]?     Constitutional: Alert and oriented. Eyes: Conjunctivae are normal. Head: Atraumatic. Nose: No congestion/rhinnorhea.  Mouth/Throat: Mucous membranes are moist. Neck: Normal ROM Cardiovascular: Normal rate, regular rhythm. Grossly normal heart sounds. Respiratory: Normal respiratory effort.  No retractions. Lungs CTAB. Gastrointestinal: Soft and nontender. No distention. Genitourinary: deferred Musculoskeletal: No lower extremity tenderness nor edema. Neurologic:  Normal speech and language. No gross focal neurologic deficits are appreciated. Skin:  Skin is warm, dry and intact. No rash noted. Psychiatric: Mood and affect are normal. Speech and behavior are normal.  ____________________________________________   LABS (all labs ordered are listed, but only abnormal results are displayed)  Labs Reviewed  BASIC METABOLIC PANEL - Abnormal; Notable for the following components:      Result Value   Glucose, Bld 103 (*)    GFR calc non Af Amer 57 (*)    All other components within normal limits  CBC WITH DIFFERENTIAL/PLATELET  TROPONIN I (HIGH SENSITIVITY)   ____________________________________________  EKG  ED ECG REPORT I, Blake Divine, the attending physician, personally viewed and interpreted this ECG.   Date: 07/11/2019  EKG Time: 12:09  Rate: 71  Rhythm: normal sinus rhythm  Axis: Normal  Intervals:left anterior fascicular block  ST&T Change: None    PROCEDURES  Procedure(s) performed (including Critical Care):  Procedures   ____________________________________________   INITIAL IMPRESSION / ASSESSMENT AND PLAN / ED COURSE       73 year old female presents after seizure-like episode while receiving anesthesia for outpatient colonoscopy.  Relatively low suspicion for seizure given she did not bite her tongue, urinate on herself, or have a postictal state.  She is neurologically intact upon arrival to the ED.  EKG without evidence of arrhythmia or ischemia, labs unremarkable, including troponin.  CT head was concerning for possible basilar artery occlusion, however CTA  performed and negative for acute process.  CTA adequate for ruling out basilar artery occlusion per neuroradiology.  Counseled patient to follow-up with PCP for possible echocardiogram, otherwise return to the ED for new or worsening symptoms.  Patient agrees with plan.      ____________________________________________   FINAL CLINICAL IMPRESSION(S) / ED DIAGNOSES  Final diagnoses:  Seizure-like activity ([place])  Complication of anesthesia, initial encounter     ED Discharge Orders    None       Note:  This document was prepared using Dragon voice recognition software and may include unintentional dictation errors.   Blake Divine, MD 07/11/19 [phone]

## 2019-07-11 NOTE — ED Notes (Signed)
Per Dr Charna Archer 2nd troponin canceled

## 2019-07-11 NOTE — Transfer of Care (Signed)
Immediate Anesthesia Transfer of Care Note  Patient: Shelia Taylor  Procedure(s) Performed: COLONOSCOPY WITH PROPOFOL (N/A )  Patient Location: PACU  Anesthesia Type: General  Level of Consciousness: awake, alert  and patient cooperative  Airway and Oxygen Therapy: Patient Spontanous Breathing and Patient connected to supplemental oxygen  Post-op Assessment: Post-op Vital signs reviewed, Patient's Cardiovascular Status Stable, Respiratory Function Stable, Patent Airway and No signs of Nausea or vomiting  Post-op Vital Signs: Reviewed and stable  Complications: Seizure-like activity with induction of anesthesia with lidocaine and propofol, stopped with 2mg  versed. Case cancelled. See note from Dr. Earl Gala.

## 2019-07-11 NOTE — H&P (Signed)
Lucilla Lame, MD [facility] [address], [place], [place] [address] Phone:[phone] Fax : [phone]  Primary Care Physician:  Glean Hess, MD Primary Gastroenterologist:  Dr. Allen Norris  Pre-Procedure History & Physical: HPI:  Shelia Taylor is a 73 y.o. female is here for an colonoscopy.   Past Medical History:  Diagnosis Date  . DUB (dysfunctional uterine bleeding)   . DUB (dysfunctional uterine bleeding)   . Endometrial polyp   . Hypertension   . Motion sickness    back seat cars  . Osteoporosis     Past Surgical History:  Procedure Laterality Date  . APPENDECTOMY    . BREAST EXCISIONAL BIOPSY Left 1970's   NEG  . CATARACT EXTRACTION, BILATERAL  2017  . DILATION AND CURETTAGE OF UTERUS    . TUBAL LIGATION      Prior to Admission medications   Medication Sig Start Date End Date Taking? Authorizing Provider  aspirin 81 MG tablet Take 81 mg by mouth daily.   Yes [provider]  Calcium Carbonate-Vitamin D (CALCIUM + D PO) Take by mouth 2 (two) times daily.     Yes [provider]  enalapril (VASOTEC) 2.5 MG tablet Take 2.5 mg by mouth daily.  04/20/18  Yes [provider]  Multiple Vitamin (MULTIVITAMIN) tablet Take 1 tablet by mouth daily.     Yes [provider]  rosuvastatin (CRESTOR) 10 MG tablet TAKE 1 TABLET BY MOUTH MONDAY, [facility] AND FRIDAY 02/18/18  Yes [provider]    Allergies as of 06/09/2019  . (No Known Allergies)    Family History  Problem Relation Age of Onset  . Diabetes Mother   . Hypertension Father   . Cancer Father        colon cancer  . Diabetes Sister   . Breast cancer Cousin        Paternal 1st cousin    Social History   Socioeconomic History  . Marital status: Widowed    Spouse name: Not on file  . Number of children: 0  . Years of education: Not on file  . Highest education level: Not on file  Occupational History  . Not on file  Social Needs  . Financial  resource strain: Not on file  . Food insecurity    Worry: Not on file    Inability: Not on file  . Transportation needs    Medical: Not on file    Non-medical: Not on file  Tobacco Use  . Smoking status: Never Smoker  . Smokeless tobacco: Never Used  Substance and Sexual Activity  . Alcohol use: Yes    Alcohol/week: 10.0 standard drinks    Types: 10 Glasses of wine per week  . Drug use: Never  . Sexual activity: Never    Birth control/protection: Surgical  Lifestyle  . Physical activity    Days per week: 6 days    Minutes per session: 60 min  . Stress: Not on file  Relationships  . Social Herbalist on phone: Not on file    Gets together: Not on file    Attends religious service: Not on file    Active member of club or organization: Not on file    Attends meetings of clubs or organizations: Not on file    Relationship status: Not on file  . Intimate partner violence    Fear of current or ex partner: Not on file    Emotionally abused: Not on  file    Physically abused: Not on file    Forced sexual activity: Not on file  Other Topics Concern  . Not on file  Social History Narrative  . Not on file    Review of Systems: See HPI, otherwise negative ROS  Physical Exam: BP (!) 168/87   Pulse 83   Temp 97.8 F (36.6 C)   Ht 5\' 7"  (1.702 m)   Wt 58.5 kg   SpO2 98%   BMI 20.20 kg/m  General:   Alert,  pleasant and cooperative in NAD Head:  Normocephalic and atraumatic. Neck:  Supple; no masses or thyromegaly. Lungs:  Clear throughout to auscultation.    Heart:  Regular rate and rhythm. Abdomen:  Soft, nontender and nondistended. Normal bowel sounds, without guarding, and without rebound.   Neurologic:  Alert and  oriented x4;  grossly normal neurologically.  Impression/Plan: Shelia Taylor is here for an colonoscopy to be performed for family history of colon cancer.  Risks, benefits, limitations, and alternatives regarding  colonoscopy have been  reviewed with the patient.  Questions have been answered.  All parties agreeable.   Lucilla Lame, MD  07/11/2019, 10:34 AM

## 2019-07-11 NOTE — Anesthesia Preprocedure Evaluation (Signed)
Anesthesia Evaluation  Patient identified by MRN, date of birth, ID band Patient awake    Reviewed: Allergy & Precautions, H&P , NPO status , Patient's Chart, lab work & pertinent test results, reviewed documented beta blocker date and time   Airway Mallampati: II  TM Distance: >3 FB Neck ROM: full    Dental no notable dental hx.    Pulmonary neg pulmonary ROS,    Pulmonary exam normal breath sounds clear to auscultation       Cardiovascular Exercise Tolerance: Good hypertension,  Rhythm:regular Rate:Normal     Neuro/Psych negative neurological ROS  negative psych ROS   GI/Hepatic negative GI ROS, Neg liver ROS,   Endo/Other  negative endocrine ROS  Renal/GU negative Renal ROS  Female GU complaint (dysfunctional uterine bleeding)     Musculoskeletal   Abdominal   Peds  Hematology negative hematology ROS (+)   Anesthesia Other Findings   Reproductive/Obstetrics negative OB ROS                             Anesthesia Physical Anesthesia Plan  ASA: II  Anesthesia Plan: General   Post-op Pain Management:    Induction:   PONV Risk Score and Plan:   Airway Management Planned:   Additional Equipment:   Intra-op Plan:   Post-operative Plan:   Informed Consent: I have reviewed the patients History and Physical, chart, labs and discussed the procedure including the risks, benefits and alternatives for the proposed anesthesia with the patient or authorized representative who has indicated his/her understanding and acceptance.     Dental Advisory Given  Plan Discussed with: CRNA  Anesthesia Plan Comments:         Anesthesia Quick Evaluation

## 2019-07-12 ENCOUNTER — Telehealth: Payer: Self-pay

## 2019-07-12 LAB — GLUCOSE, CAPILLARY: Glucose-Capillary: 104 mg/dL — ABNORMAL HIGH (ref 70–99)

## 2019-07-12 NOTE — Telephone Encounter (Signed)
Patient agrees to cologuard and says she knows nothing about a echo.

## 2019-07-12 NOTE — Telephone Encounter (Signed)
Patient had allergic reaction during Colonoscopy so they stopped it. They said it was caused by anesthesia. She showed signs of seizure when body started jerking all over and they sent her to ED. ED advised her to F/U with PCP but she feels fine since coming off the anesthesia  and being brought back to alert and awake. Do you need to order Cologaurd or see her for any reason?

## 2019-07-12 NOTE — Telephone Encounter (Signed)
I do not believe that I need to see her in follow up although the ED mentioned something about getting a cardiac ECHO. As far as colon cancer screening, she can do a cologuard and if it is negative we are fine.  If it is positive, she will need some other form of colonoscopy to be determined by GI.

## 2019-07-13 ENCOUNTER — Other Ambulatory Visit: Payer: Self-pay | Admitting: Internal Medicine

## 2019-07-13 DIAGNOSIS — R569 Unspecified convulsions: Secondary | ICD-10-CM

## 2019-07-19 DIAGNOSIS — T7840XA Allergy, unspecified, initial encounter: Secondary | ICD-10-CM

## 2019-07-19 HISTORY — DX: Allergy, unspecified, initial encounter: T78.40XA

## 2019-07-21 ENCOUNTER — Encounter: Payer: Self-pay | Admitting: Gastroenterology

## 2019-07-27 DIAGNOSIS — H26499 Other secondary cataract, unspecified eye: Secondary | ICD-10-CM | POA: Diagnosis not present

## 2019-07-28 DIAGNOSIS — Z23 Encounter for immunization: Secondary | ICD-10-CM | POA: Diagnosis not present

## 2019-11-24 ENCOUNTER — Ambulatory Visit: Payer: Medicare Other | Admitting: Internal Medicine

## 2019-11-28 ENCOUNTER — Encounter: Payer: Self-pay | Admitting: Internal Medicine

## 2019-11-28 ENCOUNTER — Other Ambulatory Visit: Payer: Self-pay

## 2019-11-28 ENCOUNTER — Ambulatory Visit (INDEPENDENT_AMBULATORY_CARE_PROVIDER_SITE_OTHER): Payer: Medicare Other | Admitting: Internal Medicine

## 2019-11-28 VITALS — BP 132/60 | HR 81 | Temp 98.0°F | Ht 67.0 in | Wt 135.0 lb

## 2019-11-28 DIAGNOSIS — I10 Essential (primary) hypertension: Secondary | ICD-10-CM | POA: Diagnosis not present

## 2019-11-28 NOTE — Patient Instructions (Signed)
Covid Vaccine locations: [employer] Dept. [place]  [email or web address]

## 2019-11-28 NOTE — Progress Notes (Signed)
Date:  11/28/2019   Name:  Shelia Taylor   DOB:  [birth year]   MRN:  [id number]   Chief Complaint: Hypertension (6 month follow up.)  Hypertension This is a chronic problem. The problem is controlled (at home 130/70). Pertinent negatives include no chest pain, headaches, palpitations, peripheral edema or shortness of breath. Past treatments include ACE inhibitors. The current treatment provides significant improvement.   Immunization History  Administered Date(s) Administered  . Influenza, High Dose Seasonal PF 08/13/2018  . Pneumococcal Conjugate-13 01/31/2015  . Pneumococcal Polysaccharide-23 08/07/1997, 11/15/2012  . Td 09/05/2008     Lab Results  Component Value Date   CREATININE 0.99 07/11/2019   BUN 19 07/11/2019   NA 141 07/11/2019   K 4.0 07/11/2019   CL 107 07/11/2019   CO2 22 07/11/2019   Lab Results  Component Value Date   CHOL 193 05/20/2019   HDL 126 05/20/2019   LDLCALC 57 05/20/2019   TRIG 48 05/20/2019   CHOLHDL 1.5 05/20/2019   Lab Results  Component Value Date   TSH 1.110 05/20/2019   No results found for: HGBA1C   Review of Systems  Constitutional: Negative for chills, fatigue and fever.  Respiratory: Negative for chest tightness, shortness of breath and wheezing.   Cardiovascular: Negative for chest pain, palpitations and leg swelling.  Gastrointestinal: Negative for constipation.  Neurological: Negative for dizziness, light-headedness and headaches.    Patient Active Problem List   Diagnosis Date Noted  . Essential hypertension 05/20/2019  . Mixed hyperlipidemia 05/20/2019  . Bilateral carotid artery stenosis 05/20/2019  . Colon cancer screening 05/20/2019  . Family history of colon cancer 05/20/2019  . Endometrial polyp   . Osteoporosis of multiple sites     Allergies  Allergen Reactions  . Propofol Other (See Comments)    Altered Mental Status after propofol given prior to colonoscopy on 07/11/2019, per MD documentation  "likely not seizure".     Past Surgical History:  Procedure Laterality Date  . APPENDECTOMY    . BREAST EXCISIONAL BIOPSY Left 1970's   NEG  . CATARACT EXTRACTION, BILATERAL  2017  . DILATION AND CURETTAGE OF UTERUS    . TUBAL LIGATION      Social History   Tobacco Use  . Smoking status: Never Smoker  . Smokeless tobacco: Never Used  Substance Use Topics  . Alcohol use: Yes    Alcohol/week: 10.0 standard drinks    Types: 10 Glasses of wine per week  . Drug use: Never     Medication list has been reviewed and updated.  Current Meds  Medication Sig  . aspirin 81 MG tablet Take 81 mg by mouth every other day.   . Calcium Carbonate-Vitamin D (CALCIUM + D PO) Take by mouth 2 (two) times daily.    . enalapril (VASOTEC) 2.5 MG tablet Take 2.5 mg by mouth daily.   . Multiple Vitamin (MULTIVITAMIN) tablet Take 1 tablet by mouth daily.    . rosuvastatin (CRESTOR) 10 MG tablet TAKE 1 TABLET BY MOUTH MONDAY, [facility] AND FRIDAY    PHQ 2/9 Scores 11/28/2019 05/20/2019  PHQ - 2 Score 0 0  PHQ- 9 Score 0 -    BP Readings from Last 3 Encounters:  11/28/19 132/60  07/11/19 (!) 158/68  07/11/19 138/77    Physical Exam Vitals and nursing note reviewed.  Constitutional:      General: She is not in acute distress.    Appearance: She is well-developed.  HENT:  Head: Normocephalic and atraumatic.  Cardiovascular:     Rate and Rhythm: Normal rate and regular rhythm.     Pulses: Normal pulses.     Heart sounds: No murmur.  Pulmonary:     Effort: Pulmonary effort is normal. No respiratory distress.  Musculoskeletal:        General: Normal range of motion.     Right lower leg: No edema.     Left lower leg: No edema.  Skin:    General: Skin is warm and dry.     Capillary Refill: Capillary refill takes less than 2 seconds.     Findings: No rash.  Neurological:     General: No focal deficit present.     Mental Status: She is alert and oriented to person, place, and time.    Psychiatric:        Behavior: Behavior normal.        Thought Content: Thought content normal.     Wt Readings from Last 3 Encounters:  11/28/19 135 lb (61.2 kg)  07/11/19 129 lb (58.5 kg)  07/11/19 129 lb (58.5 kg)    BP 132/60   Pulse 81   Temp 98 F (36.7 C) (Oral)   Ht 5\' 7"  (1.702 m)   Wt 135 lb (61.2 kg)   SpO2 98%   BMI 21.14 kg/m   Assessment and Plan: 1. Essential hypertension Clinically stable exam with well controlled BP on enalapril. Tolerating medications without side effects at this time. Pt to continue current regimen and low sodium diet; benefits of regular exercise as able discussed.   Partially dictated using Editor, commissioning. Any errors are unintentional.  Halina Maidens, MD [facility]  11/28/2019

## 2019-12-16 ENCOUNTER — Telehealth: Payer: Self-pay | Admitting: *Deleted

## 2019-12-16 ENCOUNTER — Other Ambulatory Visit: Payer: Self-pay | Admitting: Internal Medicine

## 2019-12-16 DIAGNOSIS — I10 Essential (primary) hypertension: Secondary | ICD-10-CM

## 2019-12-16 MED ORDER — ENALAPRIL MALEATE 2.5 MG PO TABS: 2.5000 mg | ORAL_TABLET | Freq: Every day | ORAL | 3 refills | Status: DC

## 2019-12-16 NOTE — Telephone Encounter (Signed)
error 

## 2020-02-03 ENCOUNTER — Ambulatory Visit: Payer: Medicare Other | Attending: Internal Medicine

## 2020-02-03 DIAGNOSIS — Z23 Encounter for immunization: Secondary | ICD-10-CM

## 2020-02-03 NOTE — Progress Notes (Signed)
   Covid-19 Vaccination Clinic  Name:  Shelia Taylor    MRN: [id number] DOB: [birth year]  02/03/2020  [name] was observed post Covid-19 immunization for 15 minutes without incident. She was provided with Vaccine Information Sheet and instruction to access the V-Safe system.   [name] was instructed to call 911 with any severe reactions post vaccine: Marland Kitchen Difficulty breathing  . Swelling of face and throat  . A fast heartbeat  . A bad rash all over body  . Dizziness and weakness   Immunizations Administered    Name Date Dose VIS Date Route   Pfizer COVID-19 Vaccine 02/03/2020  9:38 AM 0.3 mL 11/30/2018 Intramuscular   Manufacturer: [place]   Lot: [id number]   [place]: [id number]

## 2020-02-17 ENCOUNTER — Other Ambulatory Visit: Payer: Self-pay | Admitting: Internal Medicine

## 2020-02-17 MED ORDER — ROSUVASTATIN CALCIUM 10 MG PO TABS: ORAL_TABLET | ORAL | 1 refills | Status: DC

## 2020-02-17 NOTE — Telephone Encounter (Signed)
Medication Refill - Medication: rosuvastatin (CRESTOR) 10 MG tablet    Preferred Pharmacy (with phone number or street name):  [facility], [place] Phone:  [phone]  Fax:  [phone]       Agent: Please be advised that RX refills may take up to 3 business days. We ask that you follow-up with your pharmacy.

## 2020-02-17 NOTE — Telephone Encounter (Signed)
Requested medication (s) are due for refill today - unknown  Requested medication (s) are on the active medication list -yes  Future visit scheduled -yes  Last refill: historical  Notes to clinic: Patient requesting RF of historical medication- sent for PCP review   Requested Prescriptions  Pending Prescriptions Disp Refills   rosuvastatin (CRESTOR) 10 MG tablet   1      Cardiovascular:  Antilipid - Statins Passed - 02/17/2020  9:26 AM      Passed - Total Cholesterol in normal range and within 360 days    Cholesterol, Total  Date Value Ref Range Status  05/20/2019 193 100 - 199 mg/dL Final          Passed - LDL in normal range and within 360 days    LDL Calculated  Date Value Ref Range Status  05/20/2019 57 0 - 99 mg/dL Final          Passed - HDL in normal range and within 360 days    HDL  Date Value Ref Range Status  05/20/2019 126 >39 mg/dL Final          Passed - Triglycerides in normal range and within 360 days    Triglycerides  Date Value Ref Range Status  05/20/2019 48 0 - 149 mg/dL Final          Passed - Patient is not pregnant      Passed - Valid encounter within last 12 months    Recent Outpatient Visits           2 months ago Essential hypertension   [facility] Glean Hess, MD   9 months ago Medicare annual wellness visit, subsequent   [facility] Glean Hess, MD       Future Appointments             In 3 months Army Melia Jesse Sans, MD [facility], [facility]                Requested Prescriptions  Pending Prescriptions Disp Refills   rosuvastatin (CRESTOR) 10 MG tablet   1      Cardiovascular:  Antilipid - Statins Passed - 02/17/2020  9:26 AM      Passed - Total Cholesterol in normal range and within 360 days    Cholesterol, Total  Date Value Ref Range Status  05/20/2019 193 100 - 199 mg/dL Final          Passed - LDL in normal range and within 360 days    LDL Calculated  Date Value Ref  Range Status  05/20/2019 57 0 - 99 mg/dL Final          Passed - HDL in normal range and within 360 days    HDL  Date Value Ref Range Status  05/20/2019 126 >39 mg/dL Final          Passed - Triglycerides in normal range and within 360 days    Triglycerides  Date Value Ref Range Status  05/20/2019 48 0 - 149 mg/dL Final          Passed - Patient is not pregnant      Passed - Valid encounter within last 12 months    Recent Outpatient Visits           2 months ago Essential hypertension   [place], [place]   9 months ago Medicare annual wellness visit, subsequent   [place],  Jesse Sans, MD       Future Appointments             In 3 months Army Melia Jesse Sans, MD [facility], [facility]

## 2020-03-06 ENCOUNTER — Ambulatory Visit: Payer: Medicare Other | Attending: Internal Medicine

## 2020-03-06 DIAGNOSIS — Z23 Encounter for immunization: Secondary | ICD-10-CM

## 2020-03-06 NOTE — Progress Notes (Signed)
   Covid-19 Vaccination Clinic  Name:  Shelia Taylor    MRN: [id number] DOB: March 20, 1946  03/06/2020  [name] was observed post Covid-19 immunization for 15 minutes without incident. She was provided with Vaccine Information Sheet and instruction to access the V-Safe system.   [name] was instructed to call 911 with any severe reactions post vaccine: Marland Kitchen Difficulty breathing  . Swelling of face and throat  . A fast heartbeat  . A bad rash all over body  . Dizziness and weakness   Immunizations Administered    Name Date Dose VIS Date Route   Pfizer COVID-19 Vaccine 03/06/2020  9:44 AM 0.3 mL 11/30/2018 Intramuscular   Manufacturer: [place]   Lot: [id number]   [place]: [id number]

## 2020-05-23 ENCOUNTER — Other Ambulatory Visit: Payer: Self-pay

## 2020-05-23 ENCOUNTER — Ambulatory Visit (INDEPENDENT_AMBULATORY_CARE_PROVIDER_SITE_OTHER): Payer: Medicare Other

## 2020-05-23 VITALS — BP 144/72 | HR 84 | Temp 98.2°F | Resp 16 | Ht 67.0 in | Wt 136.4 lb

## 2020-05-23 DIAGNOSIS — Z1231 Encounter for screening mammogram for malignant neoplasm of breast: Secondary | ICD-10-CM

## 2020-05-23 DIAGNOSIS — Z0000 Encounter for general adult medical examination without abnormal findings: Secondary | ICD-10-CM

## 2020-05-23 DIAGNOSIS — Z780 Asymptomatic menopausal state: Secondary | ICD-10-CM | POA: Diagnosis not present

## 2020-05-23 NOTE — Progress Notes (Signed)
Subjective:   Shelia Taylor is a 74 y.o. female who presents for Medicare Annual (Subsequent) preventive examination.  Review of Systems     Cardiac Risk Factors include: advanced age (>60men, >56 women);dyslipidemia;hypertension     Objective:    Today's Vitals   05/23/20 0921  BP: (!) 144/72  Pulse: 84  Resp: 16  Temp: 98.2 F (36.8 C)  TempSrc: Oral  SpO2: 99%  Weight: 136 lb 6.4 oz (61.9 kg)  Height: 5\' 7"  (1.702 m)   Body mass index is 21.36 kg/m.  Advanced Directives 05/23/2020 07/11/2019 07/11/2019 03/22/2018  Does Patient Have a Medical Advance Directive? No No No No  Does patient want to make changes to medical advance directive? - - No - Patient declined -  Would patient like information on creating a medical advance directive? No - Patient declined - - No - Patient declined    Current Medications (verified) Outpatient Encounter Medications as of 05/23/2020  Medication Sig  . aspirin 81 MG tablet Take 81 mg by mouth every other day.   . Calcium Carbonate-Vitamin D (CALCIUM + D PO) Take by mouth 2 (two) times daily.    . enalapril (VASOTEC) 2.5 MG tablet Take 1 tablet (2.5 mg total) by mouth daily.  . Ivermectin 1 % CREA SMARTSIG:Topical Every Morning  . Multiple Vitamin (MULTIVITAMIN) tablet Take 1 tablet by mouth daily.    . rosuvastatin (CRESTOR) 10 MG tablet TAKE 1 TABLET BY MOUTH MONDAY, WEDNESDAY AND FRIDAY   No facility-administered encounter medications on file as of 05/23/2020.    Allergies (verified) Propofol   History: Past Medical History:  Diagnosis Date  . Allergy 07/19/19  . DUB (dysfunctional uterine bleeding)   . DUB (dysfunctional uterine bleeding)   . Endometrial polyp   . Hypertension   . Mixed hyperlipidemia   . Motion sickness    back seat cars  . Osteoporosis    Past Surgical History:  Procedure Laterality Date  . APPENDECTOMY    . BREAST EXCISIONAL BIOPSY Left 1970's   NEG  . CATARACT EXTRACTION, BILATERAL  2017    . DILATION AND CURETTAGE OF UTERUS    . TUBAL LIGATION     Family History  Problem Relation Age of Onset  . Diabetes Mother   . Hypertension Father   . Cancer Father        colon cancer  . Diabetes Sister   . Breast cancer Cousin        Paternal 1st cousin   Social History   Socioeconomic History  . Marital status: Widowed    Spouse name: Not on file  . Number of children: 0  . Years of education: Not on file  . Highest education level: Not on file  Occupational History  . Not on file  Tobacco Use  . Smoking status: Never Smoker  . Smokeless tobacco: Never Used  Vaping Use  . Vaping Use: Never used  Substance and Sexual Activity  . Alcohol use: Yes    Alcohol/week: 10.0 standard drinks    Types: 10 Glasses of wine per week  . Drug use: Never  . Sexual activity: Not Currently    Birth control/protection: Surgical  Other Topics Concern  . Not on file  Social History Narrative   Pt lives alone   Social Determinants of Health   Financial Resource Strain: Low Risk   . Difficulty of Paying Living Expenses: Not hard at all  Food Insecurity: No Food Insecurity  . Worried  About Running Out of Food in the Last Year: Never true  . Ran Out of Food in the Last Year: Never true  Transportation Needs: No Transportation Needs  . Lack of Transportation (Medical): No  . Lack of Transportation (Non-Medical): No  Physical Activity: Sufficiently Active  . Days of Exercise per Week: 5 days  . Minutes of Exercise per Session: 60 min  Stress: No Stress Concern Present  . Feeling of Stress : Not at all  Social Connections: Socially Isolated  . Frequency of Communication with Friends and Family: More than three times a week  . Frequency of Social Gatherings with Friends and Family: Three times a week  . Attends Religious Services: Never  . Active Member of Clubs or Organizations: No  . Attends Archivist Meetings: Never  . Marital Status: Widowed    Tobacco  Counseling Counseling given: Not Answered   Clinical Intake:  Pre-visit preparation completed: Yes  Pain : No/denies pain     BMI - recorded: 21.36 Nutritional Status: BMI of 19-24  Normal Nutritional Risks: None Diabetes: No  How often do you need to have someone help you when you read instructions, pamphlets, or other written materials from your doctor or pharmacy?: 1 - Never    Interpreter Needed?: No  Information entered by :: Clemetine Marker LPN   Activities of Daily Living In your present state of health, do you have any difficulty performing the following activities: 05/23/2020 07/11/2019  Hearing? N N  Comment declines hearing aids -  Vision? N N  Difficulty concentrating or making decisions? N N  Walking or climbing stairs? N N  Dressing or bathing? N N  Doing errands, shopping? N -  Preparing Food and eating ? N -  Using the Toilet? N -  In the past six months, have you accidently leaked urine? N -  Do you have problems with loss of bowel control? N -  Managing your Medications? N -  Managing your Finances? N -  Housekeeping or managing your Housekeeping? N -  Some recent data might be hidden    Patient Care Team: Glean Hess, MD as PCP - General (Internal Medicine) Joylene Igo, CNM as Midwife (Obstetrics and Gynecology) Serafina Mitchell, MD as Consulting Physician (Vascular Surgery)  Indicate any recent Medical Services you may have received from other than [facility] providers in the past year (date may be approximate).     Assessment:   This is a routine wellness examination for Shelia Taylor.  Hearing/Vision screen  Hearing Screening   125Hz  250Hz  500Hz  1000Hz  2000Hz  3000Hz  4000Hz  6000Hz  8000Hz   Right ear:           Left ear:           Comments: Pt denies hearing difficulty but does have occasional fluid in ear  Vision Screening Comments: Annual [facility] screenings [facility]. Dr. Edison Pace  Dietary issues and exercise activities  discussed: Current Exercise Habits: Home exercise routine, Type of exercise: Other - see comments (golf, water aerobics), Time (Minutes): 60, Frequency (Times/Week): 5, Weekly Exercise (Minutes/Week): 300, Exercise limited by: None identified  Goals    . Patient Stated     Maintain health and physical activity level.       Depression Screen PHQ 2/9 Scores 05/23/2020 11/28/2019 05/20/2019  PHQ - 2 Score 0 0 0  PHQ- 9 Score - 0 -    Fall Risk Fall Risk  05/23/2020 05/20/2019  Falls in the past  year? 0 0  Number falls in past yr: 0 0  Injury with Fall? 0 0  Risk for fall due to : No Fall Risks -  Follow up Falls prevention discussed Falls evaluation completed    Any stairs in or around the home? Yes  If so, are there any without handrails? No  Home free of loose throw rugs in walkways, pet beds, electrical cords, etc? Yes  Adequate lighting in your home to reduce risk of falls? Yes   ASSISTIVE DEVICES UTILIZED TO PREVENT FALLS:  Life alert? No  Use of a cane, walker or w/c? No  Grab bars in the bathroom? Yes  Shower chair or bench in shower? Yes  Elevated toilet seat or a handicapped toilet? No   TIMED UP AND GO:  Was the test performed? Yes .  Length of time to ambulate 10 feet: 5 sec.   Gait steady and fast without use of assistive device  Cognitive Function:     6CIT Screen 05/23/2020 05/20/2019  What Year? 0 points 0 points  What month? 0 points 0 points  What time? 0 points 0 points  Count back from 20 0 points 0 points  Months in reverse 0 points 0 points  Repeat phrase 0 points 0 points  Total Score 0 0    Immunizations Immunization History  Administered Date(s) Administered  . Influenza, High Dose Seasonal PF 08/13/2018  . Influenza-Unspecified 07/28/2019  . PFIZER SARS-COV-2 Vaccination 02/03/2020, 03/06/2020  . Pneumococcal Conjugate-13 01/31/2015  . Pneumococcal Polysaccharide-23 08/07/1997, 11/15/2012  . Td 09/05/2008    TDAP status: Due, Education  has been provided regarding the importance of this vaccine. Advised may receive this vaccine at local pharmacy or Health Dept. Aware to provide a copy of the vaccination record if obtained from local pharmacy or Health Dept. Verbalized acceptance and understanding.   Flu Vaccine status: Up to date   Pneumococcal vaccine status: Up to date   Covid-19 vaccine status: Completed vaccines  Qualifies for Shingles Vaccine? Yes   Zostavax completed No   Shingrix Completed?: No.    Education has been provided regarding the importance of this vaccine. Patient has been advised to call insurance company to determine out of pocket expense if they have not yet received this vaccine. Advised may also receive vaccine at local pharmacy or Health Dept. Verbalized acceptance and understanding.  Screening Tests Health Maintenance  Topic Date Due  . Hepatitis C Screening  Never done  . TETANUS/TDAP  09/05/2018  . COLONOSCOPY  11/14/2019  . INFLUENZA VACCINE  05/06/2020  . MAMMOGRAM  06/15/2021  . DEXA SCAN  Completed  . COVID-19 Vaccine  Completed  . PNA vac Low Risk Adult  Completed  . PAP SMEAR-Modifier  Discontinued    Health Maintenance  Health Maintenance Due  Topic Date Due  . Hepatitis C Screening  Never done  . TETANUS/TDAP  09/05/2018  . COLONOSCOPY  11/14/2019  . INFLUENZA VACCINE  05/06/2020    Colorectal cancer screening: Completed 11/13/09. Repeat every 10 years. Pt declines repeat screening colonoscopy due to reaction to propofol.   Mammogram status: Completed */10/20. Repeat every year   Bone Density status: Completed 05/25/18. Results reflect: Bone density results: OSTEOPOROSIS. Repeat every 2 years.  Lung Cancer Screening: (Low Dose CT Chest recommended if Age 76-80 years, 30 pack-year currently smoking OR have quit w/in 15years.) does not qualify.   Additional Screening:  Hepatitis C Screening: does qualify; postponed  Vision Screening: Recommended annual ophthalmology exams  for early detection of glaucoma and other disorders of the eye. Is the patient up to date with their annual eye exam?  Yes  Who is the provider or what is the name of the office in which the patient attends annual eye exams? Dr. Edison Pace  Dental Screening: Recommended annual dental exams for proper oral hygiene  Community Resource Referral / Chronic Care Management: CRR required this visit?  No   CCM required this visit?  No      Plan:     I have personally reviewed and noted the following in the patient's chart:   . Medical and social history . Use of alcohol, tobacco or illicit drugs  . Current medications and supplements . Functional ability and status . Nutritional status . Physical activity . Advanced directives . List of other physicians . Hospitalizations, surgeries, and ER visits in previous 12 months . Vitals . Screenings to include cognitive, depression, and falls . Referrals and appointments  In addition, I have reviewed and discussed with patient certain preventive protocols, quality metrics, and best practice recommendations. A written personalized care plan for preventive services as well as general preventive health recommendations were provided to patient.     Clemetine Marker, LPN   [id number]   Nurse Notes: none

## 2020-05-23 NOTE — Patient Instructions (Signed)
Shelia Taylor , Thank you for taking time to come for your Medicare Wellness Visit. I appreciate your ongoing commitment to your health goals. Please review the following plan we discussed and let me know if I can assist you in the future.   Screening recommendations/referrals: Colonoscopy: done 11/13/09 Mammogram: done 06/16/19. Please call [phone] to schedule your mammogram and bone density screening.  Bone Density: done 05/25/18 Recommended yearly ophthalmology/optometry visit for glaucoma screening and checkup Recommended yearly dental visit for hygiene and checkup  Vaccinations: Influenza vaccine: done 07/28/19 Pneumococcal vaccine: done 01/31/15 Tdap vaccine: DUE Shingles vaccine: Shingrix discussed. Please contact your pharmacy for coverage information.  Covid-19:done 02/03/20 & 03/06/20  Advanced directives: Advance directive discussed with you today. Even though you declined this today please call our office should you change your mind and we can give you the proper paperwork for you to fill out.  Conditions/risks identified: Keep up the great work!  Next appointment: Follow up in one year for your annual wellness visit    Preventive Care 65 Years and Older, Female Preventive care refers to lifestyle choices and visits with your health care provider that can promote health and wellness. What does preventive care include?  A yearly physical exam. This is also called an annual well check.  Dental exams once or twice a year.  Routine eye exams. Ask your health care provider how often you should have your eyes checked.  Personal lifestyle choices, including:  Daily care of your teeth and gums.  Regular physical activity.  Eating a healthy diet.  Avoiding tobacco and drug use.  Limiting alcohol use.  Practicing safe sex.  Taking low-dose aspirin every day.  Taking vitamin and mineral supplements as recommended by your health care provider. What happens during an  annual well check? The services and screenings done by your health care provider during your annual well check will depend on your age, overall health, lifestyle risk factors, and family history of disease. Counseling  Your health care provider may ask you questions about your:  Alcohol use.  Tobacco use.  Drug use.  Emotional well-being.  Home and relationship well-being.  Sexual activity.  Eating habits.  History of falls.  Memory and ability to understand (cognition).  Work and work Statistician.  Reproductive health. Screening  You may have the following tests or measurements:  Height, weight, and BMI.  Blood pressure.  Lipid and cholesterol levels. These may be checked every 5 years, or more frequently if you are over 56 years old.  Skin check.  Lung cancer screening. You may have this screening every year starting at age 2 if you have a 30-pack-year history of smoking and currently smoke or have quit within the past 15 years.  Fecal occult blood test (FOBT) of the stool. You may have this test every year starting at age 62.  Flexible sigmoidoscopy or colonoscopy. You may have a sigmoidoscopy every 5 years or a colonoscopy every 10 years starting at age 19.  Hepatitis C blood test.  Hepatitis B blood test.  Sexually transmitted disease (STD) testing.  Diabetes screening. This is done by checking your blood sugar (glucose) after you have not eaten for a while (fasting). You may have this done every 1-3 years.  Bone density scan. This is done to screen for osteoporosis. You may have this done starting at age 63.  Mammogram. This may be done every 1-2 years. Talk to your health care provider about how often you should have regular mammograms.  Talk with your health care provider about your test results, treatment options, and if necessary, the need for more tests. Vaccines  Your health care provider may recommend certain vaccines, such as:  Influenza  vaccine. This is recommended every year.  Tetanus, diphtheria, and acellular pertussis (Tdap, Td) vaccine. You may need a Td booster every 10 years.  Zoster vaccine. You may need this after age 58.  Pneumococcal 13-valent conjugate (PCV13) vaccine. One dose is recommended after age 2.  Pneumococcal polysaccharide (PPSV23) vaccine. One dose is recommended after age 97. Talk to your health care provider about which screenings and vaccines you need and how often you need them. This information is not intended to replace advice given to you by your health care provider. Make sure you discuss any questions you have with your health care provider. Document Released: 10/19/2015 Document Revised: 06/11/2016 Document Reviewed: 07/24/2015 Elsevier Interactive Patient Education  2017 [employer] Prevention in the Home Falls can cause injuries. They can happen to people of all ages. There are many things you can do to make your home safe and to help prevent falls. What can I do on the outside of my home?  Regularly fix the edges of walkways and driveways and fix any cracks.  Remove anything that might make you trip as you walk through a door, such as a raised step or threshold.  Trim any bushes or trees on the path to your home.  Use bright outdoor lighting.  Clear any walking paths of anything that might make someone trip, such as rocks or tools.  Regularly check to see if handrails are loose or broken. Make sure that both sides of any steps have handrails.  Any raised decks and porches should have guardrails on the edges.  Have any leaves, snow, or ice cleared regularly.  Use sand or salt on walking paths during winter.  Clean up any spills in your garage right away. This includes oil or grease spills. What can I do in the bathroom?  Use night lights.  Install grab bars by the toilet and in the tub and shower. Do not use towel bars as grab bars.  Use non-skid mats or decals  in the tub or shower.  If you need to sit down in the shower, use a plastic, non-slip stool.  Keep the floor dry. Clean up any water that spills on the floor as soon as it happens.  Remove soap buildup in the tub or shower regularly.  Attach bath mats securely with double-sided non-slip rug tape.  Do not have throw rugs and other things on the floor that can make you trip. What can I do in the bedroom?  Use night lights.  Make sure that you have a light by your bed that is easy to reach.  Do not use any sheets or blankets that are too big for your bed. They should not hang down onto the floor.  Have a firm chair that has side arms. You can use this for support while you get dressed.  Do not have throw rugs and other things on the floor that can make you trip. What can I do in the kitchen?  Clean up any spills right away.  Avoid walking on wet floors.  Keep items that you use a lot in easy-to-reach places.  If you need to reach something above you, use a strong step stool that has a grab bar.  Keep electrical cords out of the way.  Do not use floor polish or wax that makes floors slippery. If you must use wax, use non-skid floor wax.  Do not have throw rugs and other things on the floor that can make you trip. What can I do with my stairs?  Do not leave any items on the stairs.  Make sure that there are handrails on both sides of the stairs and use them. Fix handrails that are broken or loose. Make sure that handrails are as long as the stairways.  Check any carpeting to make sure that it is firmly attached to the stairs. Fix any carpet that is loose or worn.  Avoid having throw rugs at the top or bottom of the stairs. If you do have throw rugs, attach them to the floor with carpet tape.  Make sure that you have a light switch at the top of the stairs and the bottom of the stairs. If you do not have them, ask someone to add them for you. What else can I do to help  prevent falls?  Wear shoes that:  Do not have high heels.  Have rubber bottoms.  Are comfortable and fit you well.  Are closed at the toe. Do not wear sandals.  If you use a stepladder:  Make sure that it is fully opened. Do not climb a closed stepladder.  Make sure that both sides of the stepladder are locked into place.  Ask someone to hold it for you, if possible.  Clearly mark and make sure that you can see:  Any grab bars or handrails.  First and last steps.  Where the edge of each step is.  Use tools that help you move around (mobility aids) if they are needed. These include:  Canes.  Walkers.  Scooters.  Crutches.  Turn on the lights when you go into a dark area. Replace any light bulbs as soon as they burn out.  Set up your furniture so you have a clear path. Avoid moving your furniture around.  If any of your floors are uneven, fix them.  If there are any pets around you, be aware of where they are.  Review your medicines with your doctor. Some medicines can make you feel dizzy. This can increase your chance of falling. Ask your doctor what other things that you can do to help prevent falls. This information is not intended to replace advice given to you by your health care provider. Make sure you discuss any questions you have with your health care provider. Document Released: 07/19/2009 Document Revised: 02/28/2016 Document Reviewed: 10/27/2014 Elsevier Interactive Patient Education  2017 [employer].

## 2020-05-31 ENCOUNTER — Other Ambulatory Visit: Payer: Self-pay

## 2020-05-31 ENCOUNTER — Ambulatory Visit (INDEPENDENT_AMBULATORY_CARE_PROVIDER_SITE_OTHER): Payer: Medicare Other | Admitting: Internal Medicine

## 2020-05-31 ENCOUNTER — Encounter: Payer: Self-pay | Admitting: Internal Medicine

## 2020-05-31 VITALS — BP 138/72 | HR 81 | Temp 98.2°F | Ht 67.0 in | Wt 135.4 lb

## 2020-05-31 DIAGNOSIS — E782 Mixed hyperlipidemia: Secondary | ICD-10-CM

## 2020-05-31 DIAGNOSIS — I10 Essential (primary) hypertension: Secondary | ICD-10-CM | POA: Diagnosis not present

## 2020-05-31 DIAGNOSIS — I6523 Occlusion and stenosis of bilateral carotid arteries: Secondary | ICD-10-CM | POA: Diagnosis not present

## 2020-05-31 DIAGNOSIS — Z1211 Encounter for screening for malignant neoplasm of colon: Secondary | ICD-10-CM

## 2020-05-31 DIAGNOSIS — Z1159 Encounter for screening for other viral diseases: Secondary | ICD-10-CM

## 2020-05-31 DIAGNOSIS — M810 Age-related osteoporosis without current pathological fracture: Secondary | ICD-10-CM

## 2020-05-31 NOTE — Progress Notes (Signed)
Date:  05/31/2020   Name:  Shelia Taylor   DOB:  01/03/1946   MRN:  [id number]   Chief Complaint: Hypertension (Medicare yearly. Breast Exam. No pap- aged out. ) and Colon Cancer Screening (Declined colonoscopy. Said she now has a allergy to Propofol and refuses to try a colonoscopy again because of the allergy. )  Shelia Taylor is a 74 y.o. female who presents today for her Complete Annual Exam. She feels well. She reports exercising - golf 2 x weekly, and water exerisice 2 x weekly.. She reports she is sleeping fairly well- wakes up a lot at night. Breast complaints - none.  Mammogram: 06/2019 - scheduled DEXA: 05/2018 osteoporosis hip - repeat scheduled Pap smear: aged out Colonoscopy: 2011 normal; declines further exams due to reaction to anesthesia last year  Immunization History  Administered Date(s) Administered  . Influenza, High Dose Seasonal PF 08/13/2018  . Influenza-Unspecified 07/28/2019  . PFIZER SARS-COV-2 Vaccination 02/03/2020, 03/06/2020  . Pneumococcal Conjugate-13 01/31/2015  . Pneumococcal Polysaccharide-23 08/07/1997, 11/15/2012  . Td 09/05/2008    Hypertension This is a chronic problem. The problem is controlled. Pertinent negatives include no chest pain, headaches, palpitations or shortness of breath. Past treatments include ACE inhibitors. The current treatment provides significant improvement. There are no compliance problems.  There is no history of CAD/MI or CVA.  Hyperlipidemia The problem is controlled. Pertinent negatives include no chest pain or shortness of breath. Current antihyperlipidemic treatment includes statins. The current treatment provides significant improvement of lipids.    Lab Results  Component Value Date   CREATININE 0.99 07/11/2019   BUN 19 07/11/2019   NA 141 07/11/2019   K 4.0 07/11/2019   CL 107 07/11/2019   CO2 22 07/11/2019   Lab Results  Component Value Date   CHOL 193 05/20/2019   HDL 126 05/20/2019     LDLCALC 57 05/20/2019   TRIG 48 05/20/2019   CHOLHDL 1.5 05/20/2019   Lab Results  Component Value Date   TSH 1.110 05/20/2019   No results found for: HGBA1C Lab Results  Component Value Date   WBC 5.1 07/11/2019   HGB 14.4 07/11/2019   HCT 43.5 07/11/2019   MCV 98.0 07/11/2019   PLT 280 07/11/2019   Lab Results  Component Value Date   ALT 23 05/20/2019   AST 30 05/20/2019   ALKPHOS 52 05/20/2019   BILITOT 0.5 05/20/2019     Review of Systems  Constitutional: Negative for chills, fatigue, fever and unexpected weight change.  HENT: Positive for postnasal drip and sneezing. Negative for congestion, hearing loss, tinnitus, trouble swallowing and voice change.   Eyes: Negative for visual disturbance.  Respiratory: Negative for cough, chest tightness, shortness of breath and wheezing.   Cardiovascular: Negative for chest pain, palpitations and leg swelling.  Gastrointestinal: Negative for abdominal pain, constipation, diarrhea and vomiting.  Endocrine: Negative for polydipsia and polyuria.  Genitourinary: Negative for dysuria, frequency, genital sores, vaginal bleeding and vaginal discharge.  Musculoskeletal: Negative for arthralgias, gait problem and joint swelling.  Skin: Negative for color change and rash.  Allergic/Immunologic: Positive for environmental allergies.  Neurological: Negative for dizziness, tremors, light-headedness and headaches.  Hematological: Negative for adenopathy. Does not bruise/bleed easily.  Psychiatric/Behavioral: Positive for sleep disturbance (wakes frequently). Negative for dysphoric mood. The patient is not nervous/anxious.     Patient Active Problem List   Diagnosis Date Noted  . Essential hypertension 05/20/2019  . Mixed hyperlipidemia 05/20/2019  . Bilateral carotid artery stenosis  05/20/2019  . Colon cancer screening 05/20/2019  . Family history of colon cancer 05/20/2019  . Endometrial polyp   . Osteoporosis of multiple sites      Allergies  Allergen Reactions  . Propofol Other (See Comments)    Altered Mental Status after propofol given prior to colonoscopy on 07/11/2019, per MD documentation "likely not seizure".     Past Surgical History:  Procedure Laterality Date  . APPENDECTOMY    . BREAST EXCISIONAL BIOPSY Left 1970's   NEG  . CATARACT EXTRACTION, BILATERAL  2017  . DILATION AND CURETTAGE OF UTERUS    . TUBAL LIGATION      Social History   Tobacco Use  . Smoking status: Never Smoker  . Smokeless tobacco: Never Used  Vaping Use  . Vaping Use: Never used  Substance Use Topics  . Alcohol use: Yes    Alcohol/week: 10.0 standard drinks    Types: 10 Glasses of wine per week  . Drug use: Never     Medication list has been reviewed and updated.  Current Meds  Medication Sig  . aspirin 81 MG tablet Take 81 mg by mouth every other day.   . Calcium Carbonate-Vitamin D (CALCIUM + D PO) Take by mouth 2 (two) times daily.    . enalapril (VASOTEC) 2.5 MG tablet Take 1 tablet (2.5 mg total) by mouth daily.  . Ivermectin 1 % CREA SMARTSIG:Topical Every Morning  . Multiple Vitamin (MULTIVITAMIN) tablet Take 1 tablet by mouth daily.    . rosuvastatin (CRESTOR) 10 MG tablet TAKE 1 TABLET BY MOUTH MONDAY, [facility] AND FRIDAY    PHQ 2/9 Scores 05/23/2020 11/28/2019 05/20/2019  PHQ - 2 Score 0 0 0  PHQ- 9 Score - 0 -    No flowsheet data found.  BP Readings from Last 3 Encounters:  05/31/20 138/72  05/23/20 (!) 144/72  11/28/19 132/60    Physical Exam Vitals and nursing note reviewed.  Constitutional:      General: She is not in acute distress.    Appearance: She is well-developed.  HENT:     Head: Normocephalic and atraumatic.     Right Ear: Tympanic membrane and ear canal normal.     Left Ear: Tympanic membrane and ear canal normal.     Nose:     Right Sinus: No maxillary sinus tenderness.     Left Sinus: No maxillary sinus tenderness.  Eyes:     General: No scleral icterus.        Right eye: No discharge.        Left eye: No discharge.     Conjunctiva/sclera: Conjunctivae normal.  Neck:     Thyroid: No thyromegaly.     Vascular: Carotid bruit (faint left bruit) present.  Cardiovascular:     Rate and Rhythm: Normal rate and regular rhythm.     Pulses: Normal pulses.     Heart sounds: Normal heart sounds.  Pulmonary:     Effort: Pulmonary effort is normal. No respiratory distress.     Breath sounds: No wheezing.  Chest:     Breasts:        Right: No mass, nipple discharge, skin change or tenderness.        Left: No mass, nipple discharge, skin change or tenderness.  Abdominal:     General: Bowel sounds are normal.     Palpations: Abdomen is soft.     Tenderness: There is no abdominal tenderness.  Musculoskeletal:     Cervical back:  Normal range of motion. No erythema.     Right lower leg: No edema.     Left lower leg: No edema.  Lymphadenopathy:     Cervical: No cervical adenopathy.  Skin:    General: Skin is warm and dry.     Findings: No rash.  Neurological:     Mental Status: She is alert and oriented to person, place, and time.     Cranial Nerves: No cranial nerve deficit.     Sensory: No sensory deficit.     Deep Tendon Reflexes: Reflexes are normal and symmetric.  Psychiatric:        Attention and Perception: Attention normal.        Mood and Affect: Mood normal.     Wt Readings from Last 3 Encounters:  05/31/20 135 lb 6.4 oz (61.4 kg)  05/23/20 136 lb 6.4 oz (61.9 kg)  11/28/19 135 lb (61.2 kg)    BP 138/72   Pulse 81   Temp 98.2 F (36.8 C) (Oral)   Ht 5\' 7"  (1.702 m)   Wt 135 lb 6.4 oz (61.4 kg)   SpO2 97%   BMI 21.21 kg/m   Assessment and Plan: 1. Essential hypertension Clinically stable exam with well controlled BP on enalapril. Tolerating medications without side effects at this time. Pt to continue current regimen and low sodium diet; benefits of regular exercise as able discussed. - CBC with Differential/Platelet -  TSH  2. Bilateral carotid artery stenosis Followed by Vascular surgery On Statin and aspirin - Lipid panel  3. Osteoporosis of multiple sites DEXA scheduled - Comprehensive metabolic panel  4. Mixed hyperlipidemia Tolerating statin medication without side effects at this time LDL is at goal of < 70 on current dose Continue same therapy without change at this time - Lipid panel  5. Need for hepatitis C screening test - Hepatitis C antibody  6. Colon cancer screening Unable to have colonoscopy due to reaction to Propofol and is not willing to try again with another agent Her father had colon cancer so do not want to stop screenings completely - Fecal occult blood, imunochemical   Partially dictated using Editor, commissioning. Any errors are unintentional.  Halina Maidens, MD [facility]  05/31/2020

## 2020-06-01 LAB — CBC WITH DIFFERENTIAL/PLATELET
Basophils Absolute: 0 10*3/uL (ref 0.0–0.2)
Basos: 1 %
EOS (ABSOLUTE): 0.1 10*3/uL (ref 0.0–0.4)
Eos: 2 %
Hematocrit: 39.9 % (ref 34.0–46.6)
Hemoglobin: 13.6 g/dL (ref 11.1–15.9)
Immature Grans (Abs): 0 10*3/uL (ref 0.0–0.1)
Immature Granulocytes: 0 %
Lymphocytes Absolute: 1.4 10*3/uL (ref 0.7–3.1)
Lymphs: 26 %
MCH: 33.9 pg — ABNORMAL HIGH (ref 26.6–33.0)
MCHC: 34.1 g/dL (ref 31.5–35.7)
MCV: 100 fL — ABNORMAL HIGH (ref 79–97)
Monocytes Absolute: 0.5 10*3/uL (ref 0.1–0.9)
Monocytes: 10 %
Neutrophils Absolute: 3.4 10*3/uL (ref 1.4–7.0)
Neutrophils: 61 %
Platelets: 238 10*3/uL (ref 150–450)
RBC: 4.01 x10E6/uL (ref 3.77–5.28)
RDW: 12 % (ref 11.7–15.4)
WBC: 5.5 10*3/uL (ref 3.4–10.8)

## 2020-06-01 LAB — LIPID PANEL
Chol/HDL Ratio: 1.7 ratio (ref 0.0–4.4)
Cholesterol, Total: 192 mg/dL (ref 100–199)
HDL: 110 mg/dL (ref >39)
LDL Chol Calc (NIH): 71 mg/dL (ref 0–99)
Triglycerides: 60 mg/dL (ref 0–149)
VLDL Cholesterol Cal: 11 mg/dL (ref 5–40)

## 2020-06-01 LAB — COMPREHENSIVE METABOLIC PANEL
ALT: 33 IU/L — ABNORMAL HIGH (ref 0–32)
AST: 34 IU/L (ref 0–40)
Albumin/Globulin Ratio: 1.8 (ref 1.2–2.2)
Albumin: 4.8 g/dL — ABNORMAL HIGH (ref 3.7–4.7)
Alkaline Phosphatase: 54 IU/L (ref 48–121)
BUN/Creatinine Ratio: 15 (ref 12–28)
BUN: 15 mg/dL (ref 8–27)
Bilirubin Total: 0.4 mg/dL (ref 0.0–1.2)
CO2: 23 mmol/L (ref 20–29)
Calcium: 9.7 mg/dL (ref 8.7–10.3)
Chloride: 102 mmol/L (ref 96–106)
Creatinine, Ser: 1 mg/dL (ref 0.57–1.00)
GFR calc Af Amer: 65 mL/min/{1.73_m2} (ref >59)
GFR calc non Af Amer: 56 mL/min/{1.73_m2} — ABNORMAL LOW (ref >59)
Globulin, Total: 2.6 g/dL (ref 1.5–4.5)
Glucose: 81 mg/dL (ref 65–99)
Potassium: 4.8 mmol/L (ref 3.5–5.2)
Sodium: 140 mmol/L (ref 134–144)
Total Protein: 7.4 g/dL (ref 6.0–8.5)

## 2020-06-01 LAB — TSH: TSH: 0.604 u[IU]/mL (ref 0.450–4.500)

## 2020-06-01 LAB — HEPATITIS C ANTIBODY: Hep C Virus Ab: <0.1 s/co ratio (ref 0.0–0.9)

## 2020-06-10 LAB — FECAL OCCULT BLOOD, IMMUNOCHEMICAL: Fecal Occult Bld: NEGATIVE

## 2020-06-12 ENCOUNTER — Ambulatory Visit (INDEPENDENT_AMBULATORY_CARE_PROVIDER_SITE_OTHER): Payer: Medicare Other | Admitting: Physician Assistant

## 2020-06-12 ENCOUNTER — Other Ambulatory Visit: Payer: Self-pay

## 2020-06-12 ENCOUNTER — Ambulatory Visit (HOSPITAL_COMMUNITY)
Admission: RE | Admit: 2020-06-12 | Discharge: 2020-06-12 | Disposition: A | Discharge status: Home / Self Care | Payer: Medicare Other | Source: Ambulatory Visit | Attending: Vascular Surgery | Admitting: Vascular Surgery

## 2020-06-12 VITALS — BP 155/68 | HR 71 | Temp 98.4°F | Resp 20 | Ht 67.0 in | Wt 134.5 lb

## 2020-06-12 DIAGNOSIS — I6523 Occlusion and stenosis of bilateral carotid arteries: Secondary | ICD-10-CM | POA: Diagnosis present

## 2020-06-12 NOTE — Progress Notes (Signed)
Office Note     CC:  follow up Requesting Provider:  Glean Hess, MD  HPI: Shelia Taylor is a 74 y.o. (09-30-1946) female who presents routine surveillance of carotid artery stenosis.  Bilateral carotid artery bruits were first noted by her primary care physician and she underwent evaluation by Dr. Trula Slade on April 26, 2018.  She had had 2 carotid ultrasound duplex studies with differing estimations of carotid stenosis, therefore she had a CTA of the neck on July 11, 2019 which revealed no significant carotid artery stenosis.  Today, she denies extremity weakness or paralysis, slurred speech, facial drooping or monocular blindness.  She denies rest pain or claudication.  She is maintained on aspirin 81 mg as well as rosuvastatin.  She has no history of tobacco use.  She is not diabetic.  Past Medical History:  Diagnosis Date  . Allergy 07/19/19  . DUB (dysfunctional uterine bleeding)   . DUB (dysfunctional uterine bleeding)   . Endometrial polyp   . Hypertension   . Mixed hyperlipidemia   . Motion sickness    back seat cars  . Osteoporosis     Past Surgical History:  Procedure Laterality Date  . APPENDECTOMY    . BREAST EXCISIONAL BIOPSY Left 1970's   NEG  . CATARACT EXTRACTION, BILATERAL  2017  . DILATION AND CURETTAGE OF UTERUS    . TUBAL LIGATION      Social History   Socioeconomic History  . Marital status: Widowed    Spouse name: Not on file  . Number of children: 0  . Years of education: Not on file  . Highest education level: Not on file  Occupational History  . Not on file  Tobacco Use  . Smoking status: Never Smoker  . Smokeless tobacco: Never Used  Vaping Use  . Vaping Use: Never used  Substance and Sexual Activity  . Alcohol use: Yes    Alcohol/week: 10.0 standard drinks    Types: 10 Glasses of wine per week  . Drug use: Never  . Sexual activity: Not Currently    Birth control/protection: Surgical  Other Topics Concern  . Not on  file  Social History Narrative   Pt lives alone   Social Determinants of Health   Financial Resource Strain: Low Risk   . Difficulty of Paying Living Expenses: Not hard at all  Food Insecurity: No Food Insecurity  . Worried About Charity fundraiser in the Last Year: Never true  . Ran Out of Food in the Last Year: Never true  Transportation Needs: No Transportation Needs  . Lack of Transportation (Medical): No  . Lack of Transportation (Non-Medical): No  Physical Activity: Sufficiently Active  . Days of Exercise per Week: 5 days  . Minutes of Exercise per Session: 60 min  Stress: No Stress Concern Present  . Feeling of Stress : Not at all  Social Connections: Socially Isolated  . Frequency of Communication with Friends and Family: More than three times a week  . Frequency of Social Gatherings with Friends and Family: Three times a week  . Attends Religious Services: Never  . Active Member of Clubs or Organizations: No  . Attends Archivist Meetings: Never  . Marital Status: Widowed  Intimate Partner Violence: Not At Risk  . Fear of Current or Ex-Partner: No  . Emotionally Abused: No  . Physically Abused: No  . Sexually Abused: No   Family History  Problem Relation Age of Onset  . Diabetes  Mother   . Hypertension Father   . Cancer Father        colon cancer  . Diabetes Sister   . Breast cancer Cousin        Paternal 1st cousin    Current Outpatient Medications  Medication Sig Dispense Refill  . aspirin 81 MG tablet Take 81 mg by mouth every other day.     . Calcium Carbonate-Vitamin D (CALCIUM + D PO) Take by mouth 2 (two) times daily.      . enalapril (VASOTEC) 2.5 MG tablet Take 1 tablet (2.5 mg total) by mouth daily. 90 tablet 3  . Ivermectin 1 % CREA SMARTSIG:Topical Every Morning    . Multiple Vitamin (MULTIVITAMIN) tablet Take 1 tablet by mouth daily.      . rosuvastatin (CRESTOR) 10 MG tablet TAKE 1 TABLET BY MOUTH MONDAY, WEDNESDAY AND FRIDAY 90  tablet 1   No current facility-administered medications for this visit.    Allergies  Allergen Reactions  . Propofol Other (See Comments)    Altered Mental Status after propofol given prior to colonoscopy on 07/11/2019, per MD documentation "likely not seizure".      REVIEW OF SYSTEMS:   [X]  denotes positive finding, [ ]  denotes negative finding Cardiac  Comments:  Chest pain or chest pressure:    Shortness of breath upon exertion:    Short of breath when lying flat:    Irregular heart rhythm:        Vascular    Pain in calf, thigh, or hip brought on by ambulation:    Pain in feet at night that wakes you up from your sleep:     Blood clot in your veins:    Leg swelling:         Pulmonary    Oxygen at home:    Productive cough:     Wheezing:         Neurologic    Sudden weakness in arms or legs:     Sudden numbness in arms or legs:     Sudden onset of difficulty speaking or slurred speech:    Temporary loss of vision in one eye:     Problems with dizziness:         Gastrointestinal    Blood in stool:     Vomited blood:         Genitourinary    Burning when urinating:     Blood in urine:        Psychiatric    Major depression:         Hematologic    Bleeding problems:    Problems with blood clotting too easily:        Skin    Rashes or ulcers:        Constitutional    Fever or chills:      PHYSICAL EXAMINATION:  Vitals:   06/12/20 1029 06/12/20 1030  BP: (!) 152/73 (!) 155/68  Pulse: 71   Resp: 20   Temp: 98.4 F (36.9 C)   TempSrc: Temporal   SpO2: 100%   Weight: 134 lb 8 oz (61 kg)   Height: 5\' 7"  (1.702 m)     General:  WDWN in NAD; vital signs documented above Gait: Unaided, no ataxia  HENT: WNL, normocephalic Pulmonary: normal non-labored breathing , without Rales, rhonchi,  wheezing Cardiac: regular HR, without  Murmurs with carotid bruits Abdomen: soft, NT, no masses Skin: without rashes Vascular Exam/Pulses: Extremities: without  ischemic changes,  without Gangrene , without cellulitis; without open wounds;  Musculoskeletal: no muscle wasting or atrophy.  5/5 bilateral biceps, triceps strength.  5/5 lower extremity quadricep and hamstring strength.  Neurologic: A&O X 3;  No focal weakness or paresthesias are detected Psychiatric:  The pt has Normal affect.   Non-Invasive Vascular Imaging:   06/12/2020 Right Carotid: Velocities in the right ICA are consistent with a 40-59% stenosis.   Left Carotid: Velocities in the left distal ICA are consistent with a  40-59% stenosis; however, this appears to be due to mild tortuosity vs disease.   Vertebrals: Bilateral vertebral arteries demonstrate antegrade flow.  Subclavians: Normal flow hemodynamics were seen in bilateral subclavian arteries.    ASSESSMENT/PLAN:: 74 y.o. female here for follow up for bilateral carotid artery stenosis.  Mild increase in estimated left ICA stenosis, however tortuosity may conttibute to this estimation. She is asymptomatic.  We reviewed signs and symptoms of stroke/TIA.  Advised her to seek immediate medical attention for the symptoms.  We will plan to follow-up in 1 year with carotid artery duplex.   Barbie Banner, PA-C [facility] [phone]  Clinic MD:   Carlis Abbott

## 2020-06-20 ENCOUNTER — Other Ambulatory Visit: Payer: Self-pay

## 2020-06-20 ENCOUNTER — Ambulatory Visit
Admission: RE | Admit: 2020-06-20 | Discharge: 2020-06-20 | Disposition: A | Discharge status: Home / Self Care | Payer: Medicare Other | Source: Ambulatory Visit | Attending: Internal Medicine | Admitting: Internal Medicine

## 2020-06-20 DIAGNOSIS — Z780 Asymptomatic menopausal state: Secondary | ICD-10-CM

## 2020-06-20 DIAGNOSIS — Z1231 Encounter for screening mammogram for malignant neoplasm of breast: Secondary | ICD-10-CM | POA: Diagnosis present

## 2020-10-02 ENCOUNTER — Ambulatory Visit: Payer: Medicare Other | Attending: Internal Medicine

## 2020-10-02 ENCOUNTER — Other Ambulatory Visit: Payer: Self-pay | Admitting: Internal Medicine

## 2020-10-02 DIAGNOSIS — Z23 Encounter for immunization: Secondary | ICD-10-CM

## 2020-10-02 NOTE — Progress Notes (Signed)
   Covid-19 Vaccination Clinic  Name:  Shelia Taylor    MRN: [id number] DOB: 1946/04/11  10/02/2020  [name] was observed post Covid-19 immunization for 15 minutes without incident. She was provided with Vaccine Information Sheet and instruction to access the V-Safe system.   [name] was instructed to call 911 with any severe reactions post vaccine: Marland Kitchen Difficulty breathing  . Swelling of face and throat  . A fast heartbeat  . A bad rash all over body  . Dizziness and weakness   Immunizations Administered    Name Date Dose VIS Date Route   Pfizer COVID-19 Vaccine 10/02/2020  9:28 AM 0.3 mL 07/25/2020 Intramuscular   Manufacturer: [employer], [employer]   Lot: [id number]   NDC: [address]-3

## 2020-11-15 ENCOUNTER — Other Ambulatory Visit: Payer: Self-pay

## 2020-11-15 ENCOUNTER — Encounter: Payer: Self-pay | Admitting: Internal Medicine

## 2020-11-15 ENCOUNTER — Ambulatory Visit (INDEPENDENT_AMBULATORY_CARE_PROVIDER_SITE_OTHER): Payer: Medicare Other | Admitting: Internal Medicine

## 2020-11-15 VITALS — BP 144/62 | HR 90 | Temp 97.9°F | Ht 67.0 in | Wt 140.0 lb

## 2020-11-15 DIAGNOSIS — I6523 Occlusion and stenosis of bilateral carotid arteries: Secondary | ICD-10-CM

## 2020-11-15 DIAGNOSIS — R002 Palpitations: Secondary | ICD-10-CM | POA: Diagnosis not present

## 2020-11-15 DIAGNOSIS — I10 Essential (primary) hypertension: Secondary | ICD-10-CM | POA: Diagnosis not present

## 2020-11-15 MED ORDER — ENALAPRIL MALEATE 5 MG PO TABS: 5.0000 mg | ORAL_TABLET | Freq: Every day | ORAL | 0 refills | Status: DC

## 2020-11-15 NOTE — Progress Notes (Addendum)
Date:  11/15/2020   Name:  Shelia Taylor   DOB:  30-Oct-1945   MRN:  [id number]   Chief Complaint: Hypertension  Hypertension This is a chronic problem. The problem is uncontrolled (readings are high at home). Associated symptoms include palpitations. Pertinent negatives include no chest pain, headaches or shortness of breath. There are no associated agents to hypertension. Past treatments include ACE inhibitors. The current treatment provides significant improvement. also recent elevated HR and intermittent palpitations.  Hyperlipidemia This is a chronic problem. The problem is controlled. Pertinent negatives include no chest pain or shortness of breath. Current antihyperlipidemic treatment includes statins.  Palpitations  This is a new problem. The current episode started more than 1 month ago. The problem occurs intermittently. The problem has been unchanged. On average, each episode lasts 5 seconds. Nothing aggravates the symptoms. Pertinent negatives include no chest pain, coughing, dizziness, fever, numbness or shortness of breath. She has tried nothing for the symptoms. Risk factors include dyslipidemia.    Lab Results  Component Value Date   CREATININE 1.00 05/31/2020   BUN 15 05/31/2020   NA 140 05/31/2020   K 4.8 05/31/2020   CL 102 05/31/2020   CO2 23 05/31/2020   Lab Results  Component Value Date   CHOL 192 05/31/2020   HDL 110 05/31/2020   LDLCALC 71 05/31/2020   TRIG 60 05/31/2020   CHOLHDL 1.7 05/31/2020   Lab Results  Component Value Date   TSH 0.604 05/31/2020   No results found for: HGBA1C Lab Results  Component Value Date   WBC 5.5 05/31/2020   HGB 13.6 05/31/2020   HCT 39.9 05/31/2020   MCV 100 (H) 05/31/2020   PLT 238 05/31/2020   Lab Results  Component Value Date   ALT 33 (H) 05/31/2020   AST 34 05/31/2020   ALKPHOS 54 05/31/2020   BILITOT 0.4 05/31/2020     Review of Systems  Constitutional: Negative for appetite change,  fatigue, fever and unexpected weight change.  HENT: Negative for tinnitus and trouble swallowing.   Eyes: Negative for visual disturbance.  Respiratory: Negative for cough, chest tightness and shortness of breath.   Cardiovascular: Positive for palpitations. Negative for chest pain and leg swelling.  Gastrointestinal: Negative for abdominal pain.  Genitourinary: Negative for dysuria and hematuria.  Musculoskeletal: Negative for arthralgias.  Neurological: Negative for dizziness, tremors, syncope, light-headedness, numbness and headaches.  Psychiatric/Behavioral: Negative for dysphoric mood.    Patient Active Problem List   Diagnosis Date Noted  . Essential hypertension 05/20/2019  . Mixed hyperlipidemia 05/20/2019  . Bilateral carotid artery stenosis 05/20/2019  . Colon cancer screening 05/20/2019  . Family history of colon cancer 05/20/2019  . Endometrial polyp   . Osteoporosis of multiple sites     Allergies  Allergen Reactions  . Propofol Other (See Comments)    Altered Mental Status after propofol given prior to colonoscopy on 07/11/2019, per MD documentation "likely not seizure".     Past Surgical History:  Procedure Laterality Date  . APPENDECTOMY    . BREAST EXCISIONAL BIOPSY Left 1970's   NEG  . CATARACT EXTRACTION, BILATERAL  2017  . DILATION AND CURETTAGE OF UTERUS    . TUBAL LIGATION      Social History   Tobacco Use  . Smoking status: Never Smoker  . Smokeless tobacco: Never Used  Vaping Use  . Vaping Use: Never used  Substance Use Topics  . Alcohol use: Yes    Alcohol/week: 10.0 standard drinks  Types: 10 Glasses of wine per week  . Drug use: Never     Medication list has been reviewed and updated.  Current Meds  Medication Sig  . aspirin 81 MG tablet Take 81 mg by mouth every other day.   . Calcium Carbonate-Vitamin D (CALCIUM + D PO) Take by mouth 2 (two) times daily.  . enalapril (VASOTEC) 2.5 MG tablet Take 1 tablet (2.5 mg total) by mouth  daily.  . Multiple Vitamin (MULTIVITAMIN) tablet Take 1 tablet by mouth daily.  . rosuvastatin (CRESTOR) 10 MG tablet TAKE 1 TABLET BY MOUTH MONDAY, [facility] AND FRIDAY    [facility] 2/9 Scores 11/15/2020 05/23/2020 11/28/2019 05/20/2019  PHQ - 2 Score 0 0 0 0  PHQ- 9 Score 0 - 0 -    GAD 7 : Generalized Anxiety Score 11/15/2020  Nervous, Anxious, on Edge 0  Control/stop worrying 0  Worry too much - different things 0  Trouble relaxing 0  Restless 0  Easily annoyed or irritable 0  Afraid - awful might happen 0  Total GAD 7 Score 0    BP Readings from Last 3 Encounters:  11/15/20 (!) 150/78  06/12/20 (!) 155/68  05/31/20 138/72    Physical Exam Vitals and nursing note reviewed.  Constitutional:      General: She is not in acute distress.    Appearance: Normal appearance. She is well-developed.  HENT:     Head: Normocephalic and atraumatic.  Neck:     Vascular: No carotid bruit.  Cardiovascular:     Rate and Rhythm: Normal rate and regular rhythm.  No extrasystoles are present.    Heart sounds: Normal heart sounds. No murmur heard.   Pulmonary:     Effort: Pulmonary effort is normal. No respiratory distress.     Breath sounds: Normal breath sounds. No decreased breath sounds or wheezing.  Musculoskeletal:        General: Normal range of motion.     Cervical back: Normal range of motion.     Right lower leg: No edema.     Left lower leg: No edema.  Lymphadenopathy:     Cervical: No cervical adenopathy.  Skin:    General: Skin is warm and dry.     Findings: No rash.  Neurological:     Mental Status: She is alert and oriented to person, place, and time.  Psychiatric:        Attention and Perception: Attention normal.        Mood and Affect: Mood and affect and mood normal.        Behavior: Behavior normal.        Thought Content: Thought content normal.     Wt Readings from Last 3 Encounters:  11/15/20 140 lb (63.5 kg)  06/12/20 134 lb 8 oz (61 kg)  05/31/20 135 lb  6.4 oz (61.4 kg)    BP (!) 150/78   Pulse 90   Temp 97.9 F (36.6 C) (Oral)   Ht 5\' 7"  (1.702 m)   Wt 140 lb (63.5 kg)   SpO2 99%   BMI 21.93 kg/m   Assessment and Plan: 1. Essential hypertension Not well controlled - will increase dose of enalapril to 5 mg Follow up in one month - enalapril (VASOTEC) 5 MG tablet; Take 1 tablet (5 mg total) by mouth daily.  Dispense: 90 tablet; Refill: 0  2. Palpitations Will obtain labs; EKG WNL - no ectopy seen If palpitations worsen, will need Zio patch or cardiology  referral - EKG 12-Lead - SNR @ 80; WNL - Basic metabolic panel - Magnesium - TSH  3. Bilateral carotid artery stenosis Continue Crestor LDL = 71   Partially dictated using Editor, commissioning. Any errors are unintentional.  Halina Maidens, MD [facility]  11/15/2020

## 2020-11-16 LAB — BASIC METABOLIC PANEL
BUN/Creatinine Ratio: 19 (ref 12–28)
BUN: 20 mg/dL (ref 8–27)
CO2: 20 mmol/L (ref 20–29)
Calcium: 9.8 mg/dL (ref 8.7–10.3)
Chloride: 100 mmol/L (ref 96–106)
Creatinine, Ser: 1.06 mg/dL — ABNORMAL HIGH (ref 0.57–1.00)
GFR calc Af Amer: 60 mL/min/{1.73_m2} (ref >59)
GFR calc non Af Amer: 52 mL/min/{1.73_m2} — ABNORMAL LOW (ref >59)
Glucose: 104 mg/dL — ABNORMAL HIGH (ref 65–99)
Potassium: 4.9 mmol/L (ref 3.5–5.2)
Sodium: 139 mmol/L (ref 134–144)

## 2020-11-16 LAB — TSH: TSH: 1.13 u[IU]/mL (ref 0.450–4.500)

## 2020-11-16 LAB — MAGNESIUM: Magnesium: 2 mg/dL (ref 1.6–2.3)

## 2020-12-19 ENCOUNTER — Ambulatory Visit (INDEPENDENT_AMBULATORY_CARE_PROVIDER_SITE_OTHER): Payer: Medicare Other | Admitting: Internal Medicine

## 2020-12-19 ENCOUNTER — Encounter: Payer: Self-pay | Admitting: Internal Medicine

## 2020-12-19 ENCOUNTER — Other Ambulatory Visit: Payer: Self-pay

## 2020-12-19 VITALS — BP 138/80 | HR 86 | Temp 98.0°F | Ht 67.0 in | Wt 138.0 lb

## 2020-12-19 DIAGNOSIS — R002 Palpitations: Secondary | ICD-10-CM

## 2020-12-19 DIAGNOSIS — I6523 Occlusion and stenosis of bilateral carotid arteries: Secondary | ICD-10-CM

## 2020-12-19 DIAGNOSIS — I10 Essential (primary) hypertension: Secondary | ICD-10-CM

## 2020-12-19 NOTE — Progress Notes (Signed)
Date:  12/19/2020   Name:  Coretta Leisey   DOB:  01/19/1946   MRN:  [id number]   Chief Complaint: Hypertension (Reading this morning-122/58 around 8:00 this morning )  Hypertension This is a chronic problem. The problem is controlled (improving with medication change). Pertinent negatives include no chest pain, headaches, palpitations or shortness of breath. Past treatments include ACE inhibitors. The current treatment provides moderate improvement.  Palpitations  This is a recurrent problem. The problem occurs daily. The problem has been gradually worsening. On average, each episode lasts 5 seconds. Nothing aggravates the symptoms. Pertinent negatives include no chest pain, coughing, dizziness, shortness of breath or weakness.    Lab Results  Component Value Date   CREATININE 1.06 (H) 11/15/2020   BUN 20 11/15/2020   NA 139 11/15/2020   K 4.9 11/15/2020   CL 100 11/15/2020   CO2 20 11/15/2020   Lab Results  Component Value Date   CHOL 192 05/31/2020   HDL 110 05/31/2020   LDLCALC 71 05/31/2020   TRIG 60 05/31/2020   CHOLHDL 1.7 05/31/2020   Lab Results  Component Value Date   TSH 1.130 11/15/2020   No results found for: HGBA1C Lab Results  Component Value Date   WBC 5.5 05/31/2020   HGB 13.6 05/31/2020   HCT 39.9 05/31/2020   MCV 100 (H) 05/31/2020   PLT 238 05/31/2020   Lab Results  Component Value Date   ALT 33 (H) 05/31/2020   AST 34 05/31/2020   ALKPHOS 54 05/31/2020   BILITOT 0.4 05/31/2020     Review of Systems  Constitutional: Negative for fatigue and unexpected weight change.  HENT: Negative for nosebleeds.   Eyes: Negative for visual disturbance.  Respiratory: Negative for cough, chest tightness, shortness of breath and wheezing.   Cardiovascular: Negative for chest pain, palpitations and leg swelling.  Gastrointestinal: Negative for abdominal pain, constipation and diarrhea.  Neurological: Negative for dizziness, weakness,  light-headedness and headaches.    Patient Active Problem List   Diagnosis Date Noted  . Essential hypertension 05/20/2019  . Mixed hyperlipidemia 05/20/2019  . Bilateral carotid artery stenosis 05/20/2019  . Colon cancer screening 05/20/2019  . Family history of colon cancer 05/20/2019  . Endometrial polyp   . Osteoporosis of multiple sites     Allergies  Allergen Reactions  . Propofol Other (See Comments)    Altered Mental Status after propofol given prior to colonoscopy on 07/11/2019, per MD documentation "likely not seizure".     Past Surgical History:  Procedure Laterality Date  . APPENDECTOMY    . BREAST EXCISIONAL BIOPSY Left 1970's   NEG  . CATARACT EXTRACTION, BILATERAL  2017  . DILATION AND CURETTAGE OF UTERUS    . TUBAL LIGATION      Social History   Tobacco Use  . Smoking status: Never Smoker  . Smokeless tobacco: Never Used  Vaping Use  . Vaping Use: Never used  Substance Use Topics  . Alcohol use: Yes    Alcohol/week: 10.0 standard drinks    Types: 10 Glasses of wine per week  . Drug use: Never     Medication list has been reviewed and updated.  Current Meds  Medication Sig  . aspirin 81 MG tablet Take 81 mg by mouth every other day.   . Calcium Carbonate-Vitamin D (CALCIUM + D PO) Take by mouth 2 (two) times daily.  . enalapril (VASOTEC) 5 MG tablet Take 1 tablet (5 mg total) by mouth daily.  Marland Kitchen  metroNIDAZOLE (METROCREAM) 0.75 % cream Apply topically 2 (two) times daily.  . Multiple Vitamin (MULTIVITAMIN) tablet Take 1 tablet by mouth daily.  . rosuvastatin (CRESTOR) 10 MG tablet TAKE 1 TABLET BY MOUTH MONDAY, [facility] AND FRIDAY    [facility] 2/9 Scores 12/19/2020 11/15/2020 05/23/2020 11/28/2019  PHQ - 2 Score 0 0 0 0  PHQ- 9 Score 0 0 - 0    GAD 7 : Generalized Anxiety Score 12/19/2020 11/15/2020  Nervous, Anxious, on Edge 0 0  Control/stop worrying 0 0  Worry too much - different things 0 0  Trouble relaxing 0 0  Restless 0 0  Easily annoyed or  irritable 0 0  Afraid - awful might happen 0 0  Total GAD 7 Score 0 0    BP Readings from Last 3 Encounters:  12/19/20 138/80  11/15/20 (!) 144/62  06/12/20 (!) 155/68    Physical Exam Vitals and nursing note reviewed.  Constitutional:      General: She is not in acute distress.    Appearance: She is well-developed.  HENT:     Head: Normocephalic and atraumatic.  Cardiovascular:     Rate and Rhythm: Normal rate and regular rhythm.     Pulses: Normal pulses.     Heart sounds: No murmur heard.   Pulmonary:     Effort: Pulmonary effort is normal. No respiratory distress.     Breath sounds: No wheezing or rhonchi.  Musculoskeletal:        General: Normal range of motion.     Right lower leg: No edema.     Left lower leg: No edema.  Skin:    General: Skin is warm and dry.     Findings: No rash.  Neurological:     General: No focal deficit present.     Mental Status: She is alert and oriented to person, place, and time.  Psychiatric:        Mood and Affect: Mood normal.        Behavior: Behavior normal.     Wt Readings from Last 3 Encounters:  12/19/20 138 lb (62.6 kg)  11/15/20 140 lb (63.5 kg)  06/12/20 134 lb 8 oz (61 kg)    BP 138/80   Pulse 86   Temp 98 F (36.7 C) (Oral)   Ht 5\' 7"  (1.702 m)   Wt 138 lb (62.6 kg)   SpO2 98%   BMI 21.61 kg/m   Assessment and Plan: 1. Essential hypertension Clinically stable exam with improved control of BP on enalapril 5 mg. Tolerating medications without side effects at this time. Pt to continue current regimen and low sodium diet; benefits of regular exercise as able discussed.  2. Palpitations Persistent symptoms - offered Zio patch monitor but patient preferred to be referred to Cardiology - Ambulatory referral to Cardiology   Partially dictated using [employer]. Any errors are unintentional.  Halina Maidens, MD [facility]  12/19/2020

## 2021-01-21 ENCOUNTER — Ambulatory Visit (INDEPENDENT_AMBULATORY_CARE_PROVIDER_SITE_OTHER): Payer: Medicare Other

## 2021-01-21 ENCOUNTER — Other Ambulatory Visit: Payer: Self-pay

## 2021-01-21 ENCOUNTER — Ambulatory Visit (INDEPENDENT_AMBULATORY_CARE_PROVIDER_SITE_OTHER): Payer: Medicare Other | Admitting: Cardiovascular Disease

## 2021-01-21 ENCOUNTER — Encounter: Payer: Self-pay | Admitting: Cardiovascular Disease

## 2021-01-21 VITALS — BP 154/76 | HR 80 | Ht 67.0 in | Wt 142.0 lb

## 2021-01-21 DIAGNOSIS — I739 Peripheral vascular disease, unspecified: Secondary | ICD-10-CM

## 2021-01-21 DIAGNOSIS — E782 Mixed hyperlipidemia: Secondary | ICD-10-CM | POA: Diagnosis not present

## 2021-01-21 DIAGNOSIS — R002 Palpitations: Secondary | ICD-10-CM

## 2021-01-21 DIAGNOSIS — I10 Essential (primary) hypertension: Secondary | ICD-10-CM

## 2021-01-21 DIAGNOSIS — I6523 Occlusion and stenosis of bilateral carotid arteries: Secondary | ICD-10-CM

## 2021-01-21 MED ORDER — ROSUVASTATIN CALCIUM 10 MG PO TABS: ORAL_TABLET | ORAL | 3 refills | Status: DC

## 2021-01-21 NOTE — Progress Notes (Signed)
Cardiology Office Note  Date:  01/21/2021   ID:  Shelia Taylor, DOB [birth year], MRN [id number]  PCP:  Glean Hess, MD   Chief Complaint  Patient presents with  . NEW pt-Referred by Dr. Army Melia for eval of palpitations/SOB    HPI:  [name] is a 75 year old woman with a past medical history of PAD, followed by vascular in [facility] carotid stenosis 40 to 59% disease bilaterally, September 2021 Hypertension Hyperlipidemia Presenting by referral from Halina Maidens for palpitations  Recent reported palpitations primary care, Zio patch offered but patient declined  She reports problems daily, lasting 5 seconds or so, no precipitating factors No chest pain, shortness of breath, near syncope Also has periods of paroxysmal tachycardia lasting several minutes at a time More in AM, denies any precipitating causes  Active at baseline golfs 1-2 times a week, water aeobic 3-4 x a week  Blood pressure cuff not working Previously noted to be 240-973 systolic at home  EKG personally reviewed by myself on todays visit Shows normal sinus rhythm with rate 80 bpm no significant ST-T wave changes  PMH:   has a past medical history of Allergy (07/19/19), DUB (dysfunctional uterine bleeding), DUB (dysfunctional uterine bleeding), Endometrial polyp, Hypertension, Mixed hyperlipidemia, Motion sickness, and Osteoporosis.  PSH:    Past Surgical History:  Procedure Laterality Date  . APPENDECTOMY    . BREAST EXCISIONAL BIOPSY Left 1970's   NEG  . CATARACT EXTRACTION, BILATERAL  2017  . DILATION AND CURETTAGE OF UTERUS    . TUBAL LIGATION      Current Outpatient Medications  Medication Sig Dispense Refill  . aspirin 81 MG tablet Take 81 mg by mouth every other day.     . Calcium Carbonate-Vitamin D (CALCIUM + D PO) Take by mouth 2 (two) times daily.    . enalapril (VASOTEC) 5 MG tablet Take 1 tablet (5 mg total) by mouth daily. 90 tablet 0  . metroNIDAZOLE  (METROCREAM) 0.75 % cream Apply topically 2 (two) times daily.    . Multiple Vitamin (MULTIVITAMIN) tablet Take 1 tablet by mouth daily.    . rosuvastatin (CRESTOR) 10 MG tablet TAKE 1 TABLET BY MOUTH MONDAY, WEDNESDAY AND FRIDAY 90 tablet 1   No current facility-administered medications for this visit.    Allergies:   Propofol   Social History:  The patient  reports that she has never smoked. She has never used smokeless tobacco. She reports current alcohol use of about 14.0 standard drinks of alcohol per week. She reports that she does not use drugs.   Family History:   family history includes Breast cancer in her cousin; Cancer in her father; Diabetes in her mother and sister; Hypertension in her father.    Review of Systems: Review of Systems  Constitutional: Negative.   HENT: Negative.   Respiratory: Negative.   Cardiovascular: Negative.   Gastrointestinal: Negative.   Musculoskeletal: Negative.   Neurological: Negative.   Psychiatric/Behavioral: Negative.   All other systems reviewed and are negative.   PHYSICAL EXAM: VS:  BP (!) 154/76 (BP Location: Right Arm, Patient Position: Sitting, Cuff Size: Normal)   Pulse 80   Ht 5\' 7"  (1.702 m)   Wt 142 lb (64.4 kg)   SpO2 98%   BMI 22.24 kg/m  , BMI Body mass index is 22.24 kg/m. GEN: Well nourished, well developed, in no acute distress HEENT: normal Neck: no JVD, 1+ bilateral carotid bruits, no masses Cardiac: RRR; no murmurs, rubs, or gallops,no edema  Respiratory:  clear to auscultation bilaterally, normal work of breathing GI: soft, nontender, nondistended, + BS MS: no deformity or atrophy Skin: warm and dry, no rash Neuro:  Strength and sensation are intact Psych: euthymic mood, full affect   Recent Labs: 05/31/2020: ALT 33; Hemoglobin 13.6; Platelets 238 11/15/2020: BUN 20; Creatinine, Ser 1.06; Magnesium 2.0; Potassium 4.9; Sodium 139; TSH 1.130    Lipid Panel Lab Results  Component Value Date   CHOL 192  05/31/2020   HDL 110 05/31/2020   LDLCALC 71 05/31/2020   TRIG 60 05/31/2020      Wt Readings from Last 3 Encounters:  01/21/21 142 lb (64.4 kg)  12/19/20 138 lb (62.6 kg)  11/15/20 140 lb (63.5 kg)     ASSESSMENT AND PLAN:  Problem List Items Addressed This Visit      Cardiology Problems   Essential hypertension   Mixed hyperlipidemia   Bilateral carotid artery stenosis    Other Visit Diagnoses    PAD (peripheral artery disease) ([place])    -  Primary   Palpitations         Paroxysmal tachycardia Etiology unclear, possibly atrial tachycardia versus other tachyarrhythmia such as atrial fibrillation Various types of rhythms discussed with her, we have recommended a ZIO monitor This was placed today in the office  Palpitations Suspect likely APCs or PVCs Zio monitor as above to determine ectopy burden Discussed beta-blockers, she has declined at this time, She will call us if she would like to take this on a regular basis or as needed Otherwise we will wait for the results of her ZIO monitor  Hyperlipidemia Increase Crestor up to 10 mg daily Goal total cholesterol less than 150 given carotid disease If needed, could add Zetia 10 mg daily  PAD/carotid stenosis She reports this is followed in [place] Moderate disease discussed, bruits bilaterally   Total encounter time more than 60 minutes  Greater than 50% was spent in counseling and coordination of care with the patient    Signed, Esmond Plants, M.D., Ph.D. [place], [place]

## 2021-01-21 NOTE — Patient Instructions (Addendum)
Medication Instructions:  START  Crestor 10 mg daily  If you need a refill on your cardiac medications before your next appointment, please call your pharmacy.    Lab work: No new labs needed  Testing/Procedures: Zio monitor for paroxysmal tachycardia, palpitations  RESULTS  WILL APPEAR ON MYCHART, WE WILL CALL WITH  RESULTS ONCE DR. Damonique Brunelle REVIEWS  Heart monitor (Zio patch) for 2 weeks (14 days)   Your physician has recommended that you wear a Zio monitor. This monitor is a medical device that records the heart's electrical activity. Doctors most often use these monitors to diagnose arrhythmias. Arrhythmias are problems with the speed or rhythm of the heartbeat. The monitor is a small device applied to your chest. You can wear one while you do your normal daily activities. While wearing this monitor if you have any symptoms to push the button and record what you felt. Once you have worn this monitor for the period of time provider prescribed (Usually 14 days), you will return the monitor device in the postage paid box. Once it is returned they will download the data collected and provide [place] with a report which the provider will then review and we will call you with those results. Important tips:  1. Avoid showering during the first 24 hours of wearing the monitor. 2. Avoid excessive sweating to help maximize wear time. 3. Do not submerge the device, no hot tubs, and no swimming pools. 4. Keep any lotions or oils away from the patch. 5. After 24 hours you may shower with the patch on. Take brief showers with your back facing the shower head.  6. Do not remove patch once it has been placed because that will interrupt data and decrease adhesive wear time. 7. Push the button when you have any symptoms and write down what you were feeling. 8. Once you have completed wearing your monitor, remove and place into box which has postage paid and place in your outgoing mailbox.  9. If for some reason  you have misplaced your box then call our office and we can provide another box and/or mail it off for you.        Follow-Up: At [facility], you and your health needs are our priority.  As part of our continuing mission to provide you with exceptional heart care, we have created designated Provider Care Teams.  These Care Teams include your primary Cardiologist (physician) and Advanced Practice Providers (APPs -  Physician Assistants and Nurse Practitioners) who all work together to provide you with the care you need, when you need it.  . You will need a follow up appointment as needed  Call the office when you would like a follow-up appt  [phone]  . Providers on your designated Care Team:   . Murray Hodgkins, NP . Christell Faith, PA-C . Marrianne Mood, PA-C  Please monitor blood pressures and keep a log of your readings.   How to use a home blood pressure monitor. . Be still. . Measure at the same time every day. It's important to take the readings at the same time each day, such as morning and evening. Take reading approximately 1 1/2 to 2 hours after BP medications.   AVOID these things for 30 minutes before checking your blood pressure:  Drinking caffeine.  Drinking alcohol.  Eating.  Smoking.  Exercising.   Five minutes before checking your blood pressure:  Pee.  Sit in a dining chair. Avoid sitting in a soft couch or  armchair.  Be quiet. Do not talk.       Sit correctly. Sit with your back straight and supported (on a dining chair, rather than a sofa). Your feet should be flat on the floor and your legs should not be crossed. Your arm should be supported on a flat surface (such as a table) with the upper arm at heart level. Make sure the bottom of the cuff is placed directly above the bend of the elbow.    COVID-19 Vaccine Information can be found at: [email or web address].co.uk For questions related to  vaccine distribution or appointments, please email [email or web address] or call [phone].

## 2021-01-23 ENCOUNTER — Other Ambulatory Visit: Payer: Self-pay | Admitting: Internal Medicine

## 2021-01-23 DIAGNOSIS — I10 Essential (primary) hypertension: Secondary | ICD-10-CM

## 2021-02-04 DIAGNOSIS — R002 Palpitations: Secondary | ICD-10-CM | POA: Diagnosis not present

## 2021-02-06 DIAGNOSIS — R002 Palpitations: Secondary | ICD-10-CM | POA: Diagnosis not present

## 2021-03-05 ENCOUNTER — Ambulatory Visit: Payer: Medicare Other | Attending: Internal Medicine

## 2021-03-05 ENCOUNTER — Other Ambulatory Visit: Payer: Self-pay

## 2021-03-05 DIAGNOSIS — Z23 Encounter for immunization: Secondary | ICD-10-CM

## 2021-03-05 MED ORDER — PFIZER-BIONT COVID-19 VAC-TRIS 30 MCG/0.3ML IM SUSP
INTRAMUSCULAR | 0 refills | Status: DC
  Filled 2021-03-05: qty 0.3, 1d supply, fill #0

## 2021-03-05 NOTE — Progress Notes (Signed)
   Covid-19 Vaccination Clinic  Name:  Shelia Taylor    MRN: [id number] DOB: 1946-03-16  03/05/2021  [name] was observed post Covid-19 immunization for 15 minutes without incident. She was provided with Vaccine Information Sheet and instruction to access the V-Safe system.   [name] was instructed to call 911 with any severe reactions post vaccine: Marland Kitchen Difficulty breathing  . Swelling of face and throat  . A fast heartbeat  . A bad rash all over body  . Dizziness and weakness   Immunizations Administered    Name Date Dose VIS Date Route   PFIZER Comrnaty(Gray TOP) Covid-19 Vaccine 03/05/2021 11:26 AM 0.3 mL 09/13/2020 Intramuscular   Manufacturer: [employer]   Lot: [id number]   NDC: [phone]

## 2021-03-06 ENCOUNTER — Other Ambulatory Visit: Payer: Self-pay

## 2021-03-06 ENCOUNTER — Telehealth: Payer: Self-pay

## 2021-03-06 DIAGNOSIS — I6523 Occlusion and stenosis of bilateral carotid arteries: Secondary | ICD-10-CM

## 2021-03-06 NOTE — Telephone Encounter (Signed)
Unable to reach pt via phone, LDM on VM (DPR approved)  regarding her recent ZIO monitor, Dr. Rockey Situ had a chance to review her results and advised   "Event monitor  Very rare episodes of tachycardia lasting several seconds  No patient triggered events recorded  Otherwise no significant arrhythmia "  Mrs. Shelia Taylor has also viewed her results and Dr. Donivan Scull report on her MyChart on 5/30. Advised to call back with any questions or concerns.

## 2021-03-21 DIAGNOSIS — D2262 Melanocytic nevi of left upper limb, including shoulder: Secondary | ICD-10-CM | POA: Diagnosis not present

## 2021-03-21 DIAGNOSIS — L718 Other rosacea: Secondary | ICD-10-CM | POA: Diagnosis not present

## 2021-05-27 ENCOUNTER — Other Ambulatory Visit: Payer: Self-pay

## 2021-05-27 ENCOUNTER — Ambulatory Visit (INDEPENDENT_AMBULATORY_CARE_PROVIDER_SITE_OTHER): Payer: Medicare Other

## 2021-05-27 VITALS — BP 136/72 | HR 89 | Temp 98.6°F | Resp 16 | Ht 67.0 in | Wt 139.6 lb

## 2021-05-27 DIAGNOSIS — Z0000 Encounter for general adult medical examination without abnormal findings: Secondary | ICD-10-CM

## 2021-05-27 DIAGNOSIS — Z1231 Encounter for screening mammogram for malignant neoplasm of breast: Secondary | ICD-10-CM | POA: Diagnosis not present

## 2021-05-27 NOTE — Progress Notes (Signed)
Subjective:   Shelia Taylor is a 75 y.o. female who presents for Medicare Annual (Subsequent) preventive examination.  Review of Systems     Cardiac Risk Factors include: advanced age (>51mn, >>38women);dyslipidemia;hypertension     Objective:    Today's Vitals   05/27/21 0928  BP: 136/72  Pulse: 89  Resp: 16  Temp: 98.6 F (37 C)  TempSrc: Oral  SpO2: 99%  Weight: 139 lb 9.6 oz (63.3 kg)  Height: '5\' 7"'$  (1.702 m)   Body mass index is 21.86 kg/m.  Advanced Directives 05/27/2021 05/23/2020 07/11/2019 07/11/2019 03/22/2018  Does Patient Have a Medical Advance Directive? No No No No No  Does patient want to make changes to medical advance directive? - - - No - Patient declined -  Would patient like information on creating a medical advance directive? No - Patient declined No - Patient declined - - No - Patient declined    Current Medications (verified) Outpatient Encounter Medications as of 05/27/2021  Medication Sig   aspirin 81 MG tablet Take 81 mg by mouth every other day.    Calcium Carbonate-Vitamin D (CALCIUM + D PO) Take by mouth 2 (two) times daily.   enalapril (VASOTEC) 5 MG tablet TAKE 1 TABLET DAILY   metroNIDAZOLE (METROCREAM) 0.75 % cream Apply topically 2 (two) times daily.   Multiple Vitamin (MULTIVITAMIN) tablet Take 1 tablet by mouth daily.   rosuvastatin (CRESTOR) 10 MG tablet TAKE 1 TABLET BY MOUTH daily   [DISCONTINUED] COVID-19 mRNA Vac-TriS, Pfizer, (PFIZER-BIONT COVID-19 VAC-TRIS) SUSP injection Inject into the muscle.   No facility-administered encounter medications on file as of 05/27/2021.    Allergies (verified) Propofol   History: Past Medical History:  Diagnosis Date   Allergy 07/19/19   DUB (dysfunctional uterine bleeding)    DUB (dysfunctional uterine bleeding)    Endometrial polyp    Hypertension    Mixed hyperlipidemia    Motion sickness    back seat cars   Osteoporosis    Past Surgical History:  Procedure Laterality  Date   APPENDECTOMY     BREAST EXCISIONAL BIOPSY Left 1970's   NEG   CATARACT EXTRACTION, BILATERAL  2017   DILATION AND CURETTAGE OF UTERUS     TUBAL LIGATION     Family History  Problem Relation Age of Onset   Diabetes Mother    Hypertension Father    Cancer Father        colon cancer   Diabetes Sister    Breast cancer Cousin        Paternal 1st cousin   Social History   Socioeconomic History   Marital status: Widowed    Spouse name: Not on file   Number of children: 0   Years of education: Not on file   Highest education level: Not on file  Occupational History   Not on file  Tobacco Use   Smoking status: Never   Smokeless tobacco: Never  Vaping Use   Vaping Use: Never used  Substance and Sexual Activity   Alcohol use: Yes    Alcohol/week: 14.0 standard drinks    Types: 14 Glasses of wine per week    Comment: 2 glasses per day   Drug use: Never   Sexual activity: Not Currently    Birth control/protection: Surgical  Other Topics Concern   Not on file  Social History Narrative   Pt lives alone   Social Determinants of Health   Financial Resource Strain: Low Risk  Difficulty of Paying Living Expenses: Not hard at all  Food Insecurity: No Food Insecurity   Worried About [place] in the Last Year: Never true   Ran Out of Food in the Last Year: Never true  Transportation Needs: No Transportation Needs   Lack of Transportation (Medical): No   Lack of Transportation (Non-Medical): No  Physical Activity: Sufficiently Active   Days of Exercise per Week: 5 days   Minutes of Exercise per Session: 60 min  Stress: No Stress Concern Present   Feeling of Stress : Not at all  Social Connections: Socially Isolated   Frequency of Communication with Friends and Family: More than three times a week   Frequency of Social Gatherings with Friends and Family: Three times a week   Attends Religious Services: Never   Active Member of Clubs or Organizations: No    Attends Archivist Meetings: Never   Marital Status: Widowed    Tobacco Counseling Counseling given: Not Answered   Clinical Intake:  Pre-visit preparation completed: Yes  Pain : No/denies pain     BMI - recorded: 21.86 Nutritional Status: BMI of 19-24  Normal Nutritional Risks: None Diabetes: No  How often do you need to have someone help you when you read instructions, pamphlets, or other written materials from your doctor or pharmacy?: 1 - Never    Interpreter Needed?: No  Information entered by :: Clemetine Marker LPN   Activities of Daily Living In your present state of health, do you have any difficulty performing the following activities: 05/27/2021  Hearing? N  Vision? N  Difficulty concentrating or making decisions? N  Walking or climbing stairs? N  Dressing or bathing? N  Doing errands, shopping? N  Preparing Food and eating ? N  Using the Toilet? N  In the past six months, have you accidently leaked urine? N  Do you have problems with loss of bowel control? N  Managing your Medications? N  Managing your Finances? N  Housekeeping or managing your Housekeeping? N  Some recent data might be hidden    Patient Care Team: Glean Hess, MD as PCP - General (Internal Medicine) Serafina Mitchell, MD as Consulting Physician (Vascular Surgery) Oneta Rack, MD (Dermatology) Minna Merritts, MD as Consulting Physician (Cardiology)  Indicate any recent Medical Services you may have received from other than [facility] providers in the past year (date may be approximate).     Assessment:   This is a routine wellness examination for Shelia Taylor.  Hearing/Vision screen Hearing Screening - Comments:: Pt denies hearing difficulty Vision Screening - Comments:: [facility] screen[facility] Dr. Edison Pace  Dietary issues and exercise activities discussed: Current Exercise Habits: Home exercise routine, Type of exercise: walking;Other - see  comments;yoga (golf, swimming), Time (Minutes): 60, Frequency (Times/Week): 5, Weekly Exercise (Minutes/Week): 300, Intensity: Moderate, Exercise limited by: None identified   Goals Addressed             This Visit's Progress    Patient Stated   On track    Maintain health and physical activity level.        Depression Screen PHQ 2/9 Scores 05/27/2021 12/19/2020 11/15/2020 05/23/2020 11/28/2019 05/20/2019  PHQ - 2 Score 0 0 0 0 0 0  PHQ- 9 Score - 0 0 - 0 -    Fall Risk Fall Risk  05/27/2021 12/19/2020 11/15/2020 05/23/2020 05/20/2019  Falls in the past year? 0 0 0 0 0  Number  falls in past yr: 0 - - 0 0  Injury with Fall? 0 - - 0 0  Risk for fall due to : No Fall Risks - - No Fall Risks -  Follow up Falls prevention discussed Falls evaluation completed Falls evaluation completed Falls prevention discussed Falls evaluation completed    [place]:  Any stairs in or around the home? Yes  If so, are there any without handrails? No  Home free of loose throw rugs in walkways, pet beds, electrical cords, etc? Yes  Adequate lighting in your home to reduce risk of falls? Yes   ASSISTIVE DEVICES UTILIZED TO PREVENT FALLS:  Life alert? No  Use of a cane, walker or w/c? No  Grab bars in the bathroom? Yes  Shower chair or bench in shower? Yes  Elevated toilet seat or a handicapped toilet? Yes   TIMED UP AND GO:  Was the test performed? Yes .  Length of time to ambulate 10 feet: 5 sec.   Gait steady and fast without use of assistive device  Cognitive Function: Normal cognitive status assessed by direct observation by this Nurse Health Advisor. No abnormalities found.       6CIT Screen 05/23/2020 05/20/2019  What Year? 0 points 0 points  What month? 0 points 0 points  What time? 0 points 0 points  Count back from 20 0 points 0 points  Months in reverse 0 points 0 points  Repeat phrase 0 points 0 points  Total Score 0 0     Immunizations Immunization History  Administered Date(s) Administered   Influenza, High Dose Seasonal PF 08/13/2018   Influenza-Unspecified 07/28/2019, 07/06/2020   PFIZER Comirnaty(Gray Top)Covid-19 Tri-Sucrose Vaccine 03/05/2021   PFIZER(Purple Top)SARS-COV-2 Vaccination 02/03/2020, 03/06/2020, 10/02/2020   Pneumococcal Conjugate-13 01/31/2015   Pneumococcal Polysaccharide-23 08/07/1997, 11/15/2012   Td 09/05/2008    TDAP status: Due, Education has been provided regarding the importance of this vaccine. Advised may receive this vaccine at local pharmacy or Health Dept. Aware to provide a copy of the vaccination record if obtained from local pharmacy or Health Dept. Verbalized acceptance and understanding.  Flu Vaccine status: Up to date  Pneumococcal vaccine status: Up to date  Covid-19 vaccine status: Completed vaccines  Qualifies for Shingles Vaccine? Yes   Zostavax completed No   Shingrix Completed?: No.    Education has been provided regarding the importance of this vaccine. Patient has been advised to call insurance company to determine out of pocket expense if they have not yet received this vaccine. Advised may also receive vaccine at local pharmacy or Health Dept. Verbalized acceptance and understanding.  Screening Tests Health Maintenance  Topic Date Due   Zoster Vaccines- Shingrix (1 of 2) Never done   COLONOSCOPY (Pts 45-89yr Insurance coverage will need to be confirmed)  11/14/2019   INFLUENZA VACCINE  05/06/2021   TETANUS/TDAP  05/31/2021 (Originally 09/05/2018)   MAMMOGRAM  06/20/2021   DEXA SCAN  Completed   COVID-19 Vaccine  Completed   Hepatitis C Screening  Completed   PNA vac Low Risk Adult  Completed   HPV VACCINES  Aged Out   PAP SMEAR-Modifier  Discontinued    Health Maintenance  Health Maintenance Due  Topic Date Due   Zoster Vaccines- Shingrix (1 of 2) Never done   COLONOSCOPY (Pts 45-460yrInsurance coverage will need to be confirmed)   11/14/2019   INFLUENZA VACCINE  05/06/2021    Colorectal cancer screening: Type of screening: FOBT/FIT. Completed  06/05/20. Repeat every 1 years. Pt interested in Cologuard; plans to discuss at CPE  Mammogram status: Completed 06/20/20. Repeat every year  Bone Density status: Completed 06/20/20. Results reflect: Bone density results: OSTEOPOROSIS. Repeat every 2 years.  Lung Cancer Screening: (Low Dose CT Chest recommended if Age 16-80 years, 30 pack-year currently smoking OR have quit w/in 15years.) does not qualify.   Additional Screening:  Hepatitis C Screening: does qualify; Completed 05/31/20  Vision Screening: Recommended annual ophthalmology exams for early detection of glaucoma and other disorders of the eye. Is the patient up to date with their annual eye exam?  Yes  Who is the provider or what is the name of the office in which the patient attends annual eye exams? [facility] Screening: Recommended annual dental exams for proper oral hygiene  Community Resource Referral / Chronic Care Management: CRR required this visit?  No   CCM required this visit?  No      Plan:     I have personally reviewed and noted the following in the patient's chart:   Medical and social history Use of alcohol, tobacco or illicit drugs  Current medications and supplements including opioid prescriptions.  Functional ability and status Nutritional status Physical activity Advanced directives List of other physicians Hospitalizations, surgeries, and ER visits in previous 12 months Vitals Screenings to include cognitive, depression, and falls Referrals and appointments  In addition, I have reviewed and discussed with patient certain preventive protocols, quality metrics, and best practice recommendations. A written personalized care plan for preventive services as well as general preventive health recommendations were provided to patient.     Clemetine Marker, LPN   [id number]    Nurse Notes: none

## 2021-05-27 NOTE — Patient Instructions (Signed)
Ms. Aydin , Thank you for taking time to come for your Medicare Wellness Visit. I appreciate your ongoing commitment to your health goals. Please review the following plan we discussed and let me know if I can assist you in the future.   Screening recommendations/referrals: Colonoscopy: FOBT done 06/05/20; due every year. Please discuss Cologuard screening at your next appt.  Mammogram: done 06/20/20. Please call [phone] to schedule your mammogram.  Bone Density: done 06/20/20 Recommended yearly ophthalmology/optometry visit for glaucoma screening and checkup Recommended yearly dental visit for hygiene and checkup  Vaccinations: Influenza vaccine: done 07/06/20 Pneumococcal vaccine: done 01/31/15 Tdap vaccine: due Shingles vaccine: Shingrix discussed. Please contact your pharmacy for coverage information.  Covid-19:done 02/03/20, 03/06/20, 10/02/20 & 03/05/21  Advanced directives: Please bring a copy of your health care power of attorney and living will to the office at your convenience once you have completed those documents.   Conditions/risks identified: Keep up the great work!  Next appointment: Follow up in one year for your annual wellness visit    Preventive Care 65 Years and Older, Female Preventive care refers to lifestyle choices and visits with your health care provider that can promote health and wellness. What does preventive care include? A yearly physical exam. This is also called an annual well check. Dental exams once or twice a year. Routine eye exams. Ask your health care provider how often you should have your eyes checked. Personal lifestyle choices, including: Daily care of your teeth and gums. Regular physical activity. Eating a healthy diet. Avoiding tobacco and drug use. Limiting alcohol use. Practicing safe sex. Taking low-dose aspirin every day. Taking vitamin and mineral supplements as recommended by your health care provider. What happens during an  annual well check? The services and screenings done by your health care provider during your annual well check will depend on your age, overall health, lifestyle risk factors, and family history of disease. Counseling  Your health care provider may ask you questions about your: Alcohol use. Tobacco use. Drug use. Emotional well-being. Home and relationship well-being. Sexual activity. Eating habits. History of falls. Memory and ability to understand (cognition). Work and work Statistician. Reproductive health. Screening  You may have the following tests or measurements: Height, weight, and BMI. Blood pressure. Lipid and cholesterol levels. These may be checked every 5 years, or more frequently if you are over 70 years old. Skin check. Lung cancer screening. You may have this screening every year starting at age 7 if you have a 30-pack-year history of smoking and currently smoke or have quit within the past 15 years. Fecal occult blood test (FOBT) of the stool. You may have this test every year starting at age 11. Flexible sigmoidoscopy or colonoscopy. You may have a sigmoidoscopy every 5 years or a colonoscopy every 10 years starting at age 37. Hepatitis C blood test. Hepatitis B blood test. Sexually transmitted disease (STD) testing. Diabetes screening. This is done by checking your blood sugar (glucose) after you have not eaten for a while (fasting). You may have this done every 1-3 years. Bone density scan. This is done to screen for osteoporosis. You may have this done starting at age 40. Mammogram. This may be done every 1-2 years. Talk to your health care provider about how often you should have regular mammograms. Talk with your health care provider about your test results, treatment options, and if necessary, the need for more tests. Vaccines  Your health care provider may recommend certain vaccines, such as:  Influenza vaccine. This is recommended every year. Tetanus,  diphtheria, and acellular pertussis (Tdap, Td) vaccine. You may need a Td booster every 10 years. Zoster vaccine. You may need this after age 85. Pneumococcal 13-valent conjugate (PCV13) vaccine. One dose is recommended after age 7. Pneumococcal polysaccharide (PPSV23) vaccine. One dose is recommended after age 53. Talk to your health care provider about which screenings and vaccines you need and how often you need them. This information is not intended to replace advice given to you by your health care provider. Make sure you discuss any questions you have with your health care provider. Document Released: 10/19/2015 Document Revised: 06/11/2016 Document Reviewed: 07/24/2015 Elsevier Interactive Patient Education  2017 [place] Prevention in the Home Falls can cause injuries. They can happen to people of all ages. There are many things you can do to make your home safe and to help prevent falls. What can I do on the outside of my home? Regularly fix the edges of walkways and driveways and fix any cracks. Remove anything that might make you trip as you walk through a door, such as a raised step or threshold. Trim any bushes or trees on the path to your home. Use bright outdoor lighting. Clear any walking paths of anything that might make someone trip, such as rocks or tools. Regularly check to see if handrails are loose or broken. Make sure that both sides of any steps have handrails. Any raised decks and porches should have guardrails on the edges. Have any leaves, snow, or ice cleared regularly. Use sand or salt on walking paths during winter. Clean up any spills in your garage right away. This includes oil or grease spills. What can I do in the bathroom? Use night lights. Install grab bars by the toilet and in the tub and shower. Do not use towel bars as grab bars. Use non-skid mats or decals in the tub or shower. If you need to sit down in the shower, use a plastic,  non-slip stool. Keep the floor dry. Clean up any water that spills on the floor as soon as it happens. Remove soap buildup in the tub or shower regularly. Attach bath mats securely with double-sided non-slip rug tape. Do not have throw rugs and other things on the floor that can make you trip. What can I do in the bedroom? Use night lights. Make sure that you have a light by your bed that is easy to reach. Do not use any sheets or blankets that are too big for your bed. They should not hang down onto the floor. Have a firm chair that has side arms. You can use this for support while you get dressed. Do not have throw rugs and other things on the floor that can make you trip. What can I do in the kitchen? Clean up any spills right away. Avoid walking on wet floors. Keep items that you use a lot in easy-to-reach places. If you need to reach something above you, use a strong step stool that has a grab bar. Keep electrical cords out of the way. Do not use floor polish or wax that makes floors slippery. If you must use wax, use non-skid floor wax. Do not have throw rugs and other things on the floor that can make you trip. What can I do with my stairs? Do not leave any items on the stairs. Make sure that there are handrails on both sides of the stairs and use them.  Fix handrails that are broken or loose. Make sure that handrails are as long as the stairways. Check any carpeting to make sure that it is firmly attached to the stairs. Fix any carpet that is loose or worn. Avoid having throw rugs at the top or bottom of the stairs. If you do have throw rugs, attach them to the floor with carpet tape. Make sure that you have a light switch at the top of the stairs and the bottom of the stairs. If you do not have them, ask someone to add them for you. What else can I do to help prevent falls? Wear shoes that: Do not have high heels. Have rubber bottoms. Are comfortable and fit you well. Are closed  at the toe. Do not wear sandals. If you use a stepladder: Make sure that it is fully opened. Do not climb a closed stepladder. Make sure that both sides of the stepladder are locked into place. Ask someone to hold it for you, if possible. Clearly mark and make sure that you can see: Any grab bars or handrails. First and last steps. Where the edge of each step is. Use tools that help you move around (mobility aids) if they are needed. These include: Canes. Walkers. Scooters. Crutches. Turn on the lights when you go into a dark area. Replace any light bulbs as soon as they burn out. Set up your furniture so you have a clear path. Avoid moving your furniture around. If any of your floors are uneven, fix them. If there are any pets around you, be aware of where they are. Review your medicines with your doctor. Some medicines can make you feel dizzy. This can increase your chance of falling. Ask your doctor what other things that you can do to help prevent falls. This information is not intended to replace advice given to you by your health care provider. Make sure you discuss any questions you have with your health care provider. Document Released: 07/19/2009 Document Revised: 02/28/2016 Document Reviewed: 10/27/2014 Elsevier Interactive Patient Education  2017 [employer].

## 2021-06-07 ENCOUNTER — Encounter: Payer: Self-pay | Admitting: Internal Medicine

## 2021-06-07 ENCOUNTER — Other Ambulatory Visit: Payer: Self-pay

## 2021-06-07 ENCOUNTER — Ambulatory Visit (INDEPENDENT_AMBULATORY_CARE_PROVIDER_SITE_OTHER): Payer: Medicare Other | Admitting: Internal Medicine

## 2021-06-07 VITALS — BP 110/64 | HR 74 | Temp 98.0°F | Ht 67.0 in | Wt 136.0 lb

## 2021-06-07 DIAGNOSIS — Z0000 Encounter for general adult medical examination without abnormal findings: Secondary | ICD-10-CM

## 2021-06-07 DIAGNOSIS — Z1231 Encounter for screening mammogram for malignant neoplasm of breast: Secondary | ICD-10-CM

## 2021-06-07 DIAGNOSIS — I10 Essential (primary) hypertension: Secondary | ICD-10-CM

## 2021-06-07 DIAGNOSIS — E782 Mixed hyperlipidemia: Secondary | ICD-10-CM | POA: Diagnosis not present

## 2021-06-07 DIAGNOSIS — I6523 Occlusion and stenosis of bilateral carotid arteries: Secondary | ICD-10-CM

## 2021-06-07 DIAGNOSIS — M810 Age-related osteoporosis without current pathological fracture: Secondary | ICD-10-CM

## 2021-06-07 LAB — POCT URINALYSIS DIPSTICK
Bilirubin, UA: NEGATIVE
Blood, UA: NEGATIVE
Glucose, UA: NEGATIVE
Ketones, UA: NEGATIVE
Leukocytes, UA: NEGATIVE
Nitrite, UA: NEGATIVE
Protein, UA: NEGATIVE
Spec Grav, UA: >=1.03 — AB (ref 1.010–1.025)
Urobilinogen, UA: 0.2 E.U./dL
pH, UA: 6 (ref 5.0–8.0)

## 2021-06-07 NOTE — Patient Instructions (Signed)
Return in 6 mo for BP follow up if not seen by cardiology.  Please send a message when you get your Flu vaccine.

## 2021-06-07 NOTE — Progress Notes (Signed)
Date:  06/07/2021   Name:  Shelia Taylor   DOB:  [birth year]   MRN:  [id number]   Chief Complaint: Annual Exam (Breast exam no pap) Shelia Taylor is a 75 y.o. female who presents today for her Complete Annual Exam. She feels well. She reports exercising golf 1-2 times a week and water aerobics 2-3 times a week. She reports she is sleeping well. Breast complaints none.  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 The problem is controlled. Pertinent negatives include no chest pain, headaches, palpitations or shortness of breath. Past treatments include ACE inhibitors. The current treatment provides significant improvement. There are no compliance problems.  There is no history of kidney disease, CAD/MI or CVA.  Hyperlipidemia This is a chronic problem. The problem is controlled. Pertinent negatives include no chest pain or shortness of breath. Current antihyperlipidemic treatment includes statins. The current treatment provides significant improvement of lipids.   Lab Results  Component Value Date   CREATININE 1.06 (H) 11/15/2020   BUN 20 11/15/2020   NA 139 11/15/2020   K 4.9 11/15/2020   CL 100 11/15/2020   CO2 20 11/15/2020   Lab Results  Component Value Date   CHOL 192 05/31/2020   HDL 110 05/31/2020   LDLCALC 71 05/31/2020   TRIG 60 05/31/2020   CHOLHDL 1.7 05/31/2020   Lab Results  Component Value Date   TSH 1.130 11/15/2020   No results found for:  HGBA1C Lab Results  Component Value Date   WBC 5.5 05/31/2020   HGB 13.6 05/31/2020   HCT 39.9 05/31/2020   MCV 100 (H) 05/31/2020   PLT 238 05/31/2020   Lab Results  Component Value Date   ALT 33 (H) 05/31/2020   AST 34 05/31/2020   ALKPHOS 54 05/31/2020   BILITOT 0.4 05/31/2020     Review of Systems  Constitutional:  Negative for chills, fatigue and fever.  HENT:  Negative for congestion, hearing loss, tinnitus, trouble swallowing and voice change.   Eyes:  Negative for visual disturbance.  Respiratory:  Negative for cough, chest tightness, shortness of breath and wheezing.   Cardiovascular:  Negative for chest pain, palpitations and leg swelling.  Gastrointestinal:  Negative for abdominal pain, constipation, diarrhea and vomiting.  Endocrine: Negative for polydipsia and polyuria.  Genitourinary:  Negative for dysuria, frequency, genital sores, vaginal bleeding and vaginal discharge.  Musculoskeletal:  Negative for arthralgias, gait problem and joint swelling.  Skin:  Negative for color change and rash.  Neurological:  Negative for dizziness, tremors, light-headedness and headaches.  Hematological:  Negative for adenopathy. Does not bruise/bleed easily.  Psychiatric/Behavioral:  Negative for dysphoric mood and sleep disturbance. The patient is not nervous/anxious.    Patient Active Problem List   Diagnosis Date Noted   Essential hypertension 05/20/2019   Mixed hyperlipidemia 05/20/2019   Bilateral carotid artery stenosis 05/20/2019   Colon cancer screening 05/20/2019   Family history of colon cancer 05/20/2019   Endometrial polyp    Osteoporosis of multiple sites     Allergies  Allergen Reactions   Propofol Other (See Comments)  Altered Mental Status after propofol given prior to colonoscopy on 07/11/2019, per MD documentation "likely not seizure".     Past Surgical History:  Procedure Laterality Date   APPENDECTOMY     BREAST EXCISIONAL BIOPSY Left 1970's    NEG   CATARACT EXTRACTION, BILATERAL  2017   DILATION AND CURETTAGE OF UTERUS     TUBAL LIGATION      Social History   Tobacco Use   Smoking status: Never   Smokeless tobacco: Never  Vaping Use   Vaping Use: Never used  Substance Use Topics   Alcohol use: Yes    Alcohol/week: 14.0 standard drinks    Types: 14 Glasses of wine per week    Comment: 2 glasses per day   Drug use: Never     Medication list has been reviewed and updated.  Current Meds  Medication Sig   aspirin 81 MG tablet Take 81 mg by mouth every other day.    Calcium Carbonate-Vitamin D (CALCIUM + D PO) Take by mouth 2 (two) times daily.   enalapril (VASOTEC) 5 MG tablet TAKE 1 TABLET DAILY   metroNIDAZOLE (METROCREAM) 0.75 % cream Apply topically 2 (two) times daily.   Multiple Vitamin (MULTIVITAMIN) tablet Take 1 tablet by mouth daily.   rosuvastatin (CRESTOR) 10 MG tablet TAKE 1 TABLET BY MOUTH daily    PHQ 2/9 Scores 06/07/2021 05/27/2021 12/19/2020 11/15/2020  PHQ - 2 Score 0 0 0 0  PHQ- 9 Score 1 - 0 0    GAD 7 : Generalized Anxiety Score 06/07/2021 12/19/2020 11/15/2020  Nervous, Anxious, on Edge 0 0 0  Control/stop worrying 0 0 0  Worry too much - different things 0 0 0  Trouble relaxing 0 0 0  Restless 0 0 0  Easily annoyed or irritable 0 0 0  Afraid - awful might happen 0 0 0  Total GAD 7 Score 0 0 0    BP Readings from Last 3 Encounters:  06/07/21 110/64  05/27/21 136/72  01/21/21 (!) 154/76    Physical Exam Vitals and nursing note reviewed.  Constitutional:      General: She is not in acute distress.    Appearance: She is well-developed.  HENT:     Head: Normocephalic and atraumatic.     Right Ear: Tympanic membrane and ear canal normal.     Left Ear: Tympanic membrane and ear canal normal.     Nose:     Right Sinus: No maxillary sinus tenderness.     Left Sinus: No maxillary sinus tenderness.  Eyes:     General: No scleral icterus.       Right eye: No discharge.        Left eye:  No discharge.     Conjunctiva/sclera: Conjunctivae normal.  Neck:     Thyroid: No thyromegaly.     Vascular: No carotid bruit.  Cardiovascular:     Rate and Rhythm: Normal rate and regular rhythm.     Pulses: Normal pulses.     Heart sounds: Normal heart sounds.  Pulmonary:     Effort: Pulmonary effort is normal. No respiratory distress.     Breath sounds: No wheezing.  Chest:  Breasts:    Right: No mass, nipple discharge, skin change or tenderness.     Left: No mass, nipple discharge, skin change or tenderness.  Abdominal:     General: Bowel sounds are normal.     Palpations: Abdomen is soft.     Tenderness: There  is no abdominal tenderness.  Musculoskeletal:     Cervical back: Normal range of motion. No erythema.     Right lower leg: No edema.     Left lower leg: No edema.  Lymphadenopathy:     Cervical: No cervical adenopathy.  Skin:    General: Skin is warm and dry.     Findings: No rash.  Neurological:     Mental Status: She is alert and oriented to person, place, and time.     Cranial Nerves: No cranial nerve deficit.     Sensory: No sensory deficit.     Deep Tendon Reflexes: Reflexes are normal and symmetric.  Psychiatric:        Attention and Perception: Attention normal.        Mood and Affect: Mood normal.    Wt Readings from Last 3 Encounters:  06/07/21 136 lb (61.7 kg)  05/27/21 139 lb 9.6 oz (63.3 kg)  01/21/21 142 lb (64.4 kg)    BP 110/64   Pulse 74   Temp 98 F (36.7 C) (Oral)   Ht '5\' 7"'$  (1.702 m)   Wt 136 lb (61.7 kg)   BMI 21.30 kg/m   Assessment and Plan: 1. Essential hypertension Clinically stable exam with well controlled BP. Tolerating medications without side effects at this time. Pt to continue current regimen and low sodium diet; benefits of regular exercise as able discussed. - CBC with Differential/Platelet - Comprehensive metabolic panel - POCT urinalysis dipstick - TSH  2. Annual physical exam Normal exam Up to date on  screenings and immunizations  3. Encounter for screening mammogram for breast cancer scheduled  4. Mixed hyperlipidemia Now on daily Crestor without side effects - Lipid panel  5. Osteoporosis of multiple sites Continue calcium + D and regular exercise. - VITAMIN D 25 Hydroxy (Vit-D Deficiency, Fractures)   Partially dictated using Editor, commissioning. Any errors are unintentional.  Halina Maidens, MD [facility]  06/07/2021

## 2021-06-08 LAB — TSH: TSH: 1.44 u[IU]/mL (ref 0.450–4.500)

## 2021-06-08 LAB — COMPREHENSIVE METABOLIC PANEL
ALT: 29 IU/L (ref 0–32)
AST: 30 IU/L (ref 0–40)
Albumin/Globulin Ratio: 2.1 (ref 1.2–2.2)
Albumin: 5.3 g/dL — ABNORMAL HIGH (ref 3.7–4.7)
Alkaline Phosphatase: 62 IU/L (ref 44–121)
BUN/Creatinine Ratio: 23 (ref 12–28)
BUN: 26 mg/dL (ref 8–27)
Bilirubin Total: 0.4 mg/dL (ref 0.0–1.2)
CO2: 20 mmol/L (ref 20–29)
Calcium: 10 mg/dL (ref 8.7–10.3)
Chloride: 101 mmol/L (ref 96–106)
Creatinine, Ser: 1.14 mg/dL — ABNORMAL HIGH (ref 0.57–1.00)
Globulin, Total: 2.5 g/dL (ref 1.5–4.5)
Glucose: 95 mg/dL (ref 65–99)
Potassium: 5.1 mmol/L (ref 3.5–5.2)
Sodium: 138 mmol/L (ref 134–144)
Total Protein: 7.8 g/dL (ref 6.0–8.5)
eGFR: 51 mL/min/{1.73_m2} — ABNORMAL LOW (ref >59)

## 2021-06-08 LAB — CBC WITH DIFFERENTIAL/PLATELET
Basophils Absolute: 0 10*3/uL (ref 0.0–0.2)
Basos: 0 %
EOS (ABSOLUTE): 0.1 10*3/uL (ref 0.0–0.4)
Eos: 2 %
Hematocrit: 39.5 % (ref 34.0–46.6)
Hemoglobin: 13.2 g/dL (ref 11.1–15.9)
Immature Grans (Abs): 0 10*3/uL (ref 0.0–0.1)
Immature Granulocytes: 0 %
Lymphocytes Absolute: 1.8 10*3/uL (ref 0.7–3.1)
Lymphs: 30 %
MCH: 32.5 pg (ref 26.6–33.0)
MCHC: 33.4 g/dL (ref 31.5–35.7)
MCV: 97 fL (ref 79–97)
Monocytes Absolute: 0.7 10*3/uL (ref 0.1–0.9)
Monocytes: 12 %
Neutrophils Absolute: 3.4 10*3/uL (ref 1.4–7.0)
Neutrophils: 56 %
Platelets: 269 10*3/uL (ref 150–450)
RBC: 4.06 x10E6/uL (ref 3.77–5.28)
RDW: 12.7 % (ref 11.7–15.4)
WBC: 6.1 10*3/uL (ref 3.4–10.8)

## 2021-06-08 LAB — LIPID PANEL
Chol/HDL Ratio: 1.6 ratio (ref 0.0–4.4)
Cholesterol, Total: 186 mg/dL (ref 100–199)
HDL: 119 mg/dL (ref >39)
LDL Chol Calc (NIH): 52 mg/dL (ref 0–99)
Triglycerides: 89 mg/dL (ref 0–149)
VLDL Cholesterol Cal: 15 mg/dL (ref 5–40)

## 2021-06-08 LAB — VITAMIN D 25 HYDROXY (VIT D DEFICIENCY, FRACTURES): Vit D, 25-Hydroxy: 34.9 ng/mL (ref 30.0–100.0)

## 2021-06-12 ENCOUNTER — Other Ambulatory Visit: Payer: Self-pay | Admitting: *Deleted

## 2021-06-12 DIAGNOSIS — I6523 Occlusion and stenosis of bilateral carotid arteries: Secondary | ICD-10-CM

## 2021-06-17 ENCOUNTER — Other Ambulatory Visit: Payer: Self-pay

## 2021-06-17 ENCOUNTER — Encounter: Payer: Self-pay | Admitting: Physician Assistant

## 2021-06-17 ENCOUNTER — Ambulatory Visit (INDEPENDENT_AMBULATORY_CARE_PROVIDER_SITE_OTHER): Payer: Medicare Other | Admitting: Physician Assistant

## 2021-06-17 ENCOUNTER — Ambulatory Visit (HOSPITAL_COMMUNITY)
Admission: RE | Admit: 2021-06-17 | Discharge: 2021-06-17 | Disposition: A | Discharge status: Home / Self Care | Payer: Medicare Other | Source: Ambulatory Visit | Attending: Surgery | Admitting: Surgery

## 2021-06-17 VITALS — BP 125/62 | HR 69 | Temp 97.6°F | Resp 14 | Ht 67.0 in | Wt 139.0 lb

## 2021-06-17 DIAGNOSIS — I6523 Occlusion and stenosis of bilateral carotid arteries: Secondary | ICD-10-CM

## 2021-06-17 NOTE — Progress Notes (Signed)
History of Present Illness:  Patient is a 75 y.o. year old female who presents for evaluation of carotid stenosis.  The stenosis was found due to bruits on PCP exam.  She has remained asymptomatic.   The patient denies symptoms of TIA, amaurosis, or stroke.  She is maintained on aspirin 81 mg as well as rosuvastatin.  She has no history of tobacco use.  She is not diabetic.   Past Medical History:  Diagnosis Date   Allergy 07/19/19   DUB (dysfunctional uterine bleeding)    DUB (dysfunctional uterine bleeding)    Endometrial polyp    Hypertension    Mixed hyperlipidemia    Motion sickness    back seat cars   Osteoporosis     Past Surgical History:  Procedure Laterality Date   APPENDECTOMY     BREAST EXCISIONAL BIOPSY Left 1970's   NEG   CATARACT EXTRACTION, BILATERAL  2017   DILATION AND CURETTAGE OF UTERUS     TUBAL LIGATION       Social History Social History   Tobacco Use   Smoking status: Never   Smokeless tobacco: Never  Vaping Use   Vaping Use: Never used  Substance Use Topics   Alcohol use: Yes    Alcohol/week: 14.0 standard drinks    Types: 14 Glasses of wine per week    Comment: 2 glasses per day   Drug use: Never    Family History Family History  Problem Relation Age of Onset   Diabetes Mother    Hypertension Father    Cancer Father        colon cancer   Diabetes Sister    Breast cancer Cousin        Paternal 1st cousin    Allergies  Allergies  Allergen Reactions   Propofol Other (See Comments)    Altered Mental Status after propofol given prior to colonoscopy on 07/11/2019, per MD documentation "likely not seizure".      Current Outpatient Medications  Medication Sig Dispense Refill   aspirin 81 MG tablet Take 81 mg by mouth every other day.      Calcium Carbonate-Vitamin D (CALCIUM + D PO) Take by mouth 2 (two) times daily.     enalapril (VASOTEC) 5 MG tablet TAKE 1 TABLET DAILY 90 tablet 3   metroNIDAZOLE (METROCREAM) 0.75 %  cream Apply topically 2 (two) times daily.     Multiple Vitamin (MULTIVITAMIN) tablet Take 1 tablet by mouth daily.     rosuvastatin (CRESTOR) 10 MG tablet TAKE 1 TABLET BY MOUTH daily 90 tablet 3   No current facility-administered medications for this visit.    ROS:   General:  No weight loss, Fever, chills  HEENT: No recent headaches, no nasal bleeding, no visual changes, no sore throat  Neurologic: No dizziness, blackouts, seizures. No recent symptoms of stroke or mini- stroke. No recent episodes of slurred speech, or temporary blindness.  Cardiac: No recent episodes of chest pain/pressure, no shortness of breath at rest.  No shortness of breath with exertion.  Denies history of atrial fibrillation or irregular heartbeat  Vascular: No history of rest pain in feet.  No history of claudication.  No history of non-healing ulcer, No history of DVT   Pulmonary: No home oxygen, no productive cough, no hemoptysis,  No asthma or wheezing  Musculoskeletal:  '[ ]'$  Arthritis, '[ ]'$  Low back pain,  '[ ]'$  Joint pain  Hematologic:No history of hypercoagulable state.  No history of  easy bleeding.  No history of anemia  Gastrointestinal: No hematochezia or melena,  No gastroesophageal reflux, no trouble swallowing  Urinary: '[ ]'$  chronic Kidney disease, '[ ]'$  on HD - '[ ]'$  MWF or '[ ]'$  TTHS, '[ ]'$  Burning with urination, '[ ]'$  Frequent urination, '[ ]'$  Difficulty urinating;   Skin: No rashes  Psychological: No history of anxiety,  No history of depression   Physical Examination  Vitals:   06/17/21 1121 06/17/21 1124  BP: (!) 132/57 125/62  Pulse: 69 69  Resp: 14   Temp: 97.6 F (36.4 C)   TempSrc: Temporal   SpO2: 100%   Weight: 139 lb (63 kg)   Height: '5\' 7"'$  (1.702 m)     Body mass index is 21.77 kg/m.  General:  Alert and oriented, no acute distress HEENT: Normal Neck: B bruits Pulmonary: Clear to auscultation bilaterally Cardiac: Regular Rate and Rhythm without murmur Gastrointestinal:  Soft, non-tender, non-distended, no mass, no scars Skin: No rash Extremity Pulses:  2+ radial, brachial, femoral, dorsalis pedis, posterior tibial pulses bilaterally Musculoskeletal: No deformity or edema  Neurologic: Upper and lower extremity motor 5/5 and symmetric  DATA:     Right Carotid Findings:  +----------+--------+--------+--------+------------------+---------------+            PSV cm/sEDV cm/sStenosisPlaque DescriptionComments         +----------+--------+--------+--------+------------------+---------------+  CCA Prox  109     14                                                 +----------+--------+--------+--------+------------------+---------------+  CCA Mid   102     12                                                 +----------+--------+--------+--------+------------------+---------------+  CCA Distal94      12                                                 +----------+--------+--------+--------+------------------+---------------+  ICA Prox  69      15                                                 +----------+--------+--------+--------+------------------+---------------+  ICA Mid   180     45      40-59%  heterogenous      mildly tortuous  +----------+--------+--------+--------+------------------+---------------+  ICA Distal137     27                                                 +----------+--------+--------+--------+------------------+---------------+  ECA       137     6                                                  +----------+--------+--------+--------+------------------+---------------+   +----------+--------+-------+----------------+-------------------+  PSV cm/sEDV cmsDescribe        Arm Pressure (mmHG)  +----------+--------+-------+----------------+-------------------+  [id number]            Multiphasic, WNL                      +----------+--------+-------+----------------+-------------------+   +---------+--------+--+--------+--+---------+  VertebralPSV cm/s78EDV cm/s10Antegrade  +---------+--------+--+--------+--+---------+       Left Carotid Findings:  +----------+--------+--------+--------+------------------+-----------------  -+            PSV cm/sEDV cm/sStenosisPlaque DescriptionComments             +----------+--------+--------+--------+------------------+-----------------  -+  CCA Prox  108     12                                                     +----------+--------+--------+--------+------------------+-----------------  -+  CCA Mid   101     11                                intimal  thickening  +----------+--------+--------+--------+------------------+-----------------  -+  CCA Distal86      11                                intimal  thickening  +----------+--------+--------+--------+------------------+-----------------  -+  ICA Prox  81      20                                                     +----------+--------+--------+--------+------------------+-----------------  -+  ICA Mid   109     35                                                     +----------+--------+--------+--------+------------------+-----------------  -+  ICA Distal185     42                                tortuous             +----------+--------+--------+--------+------------------+-----------------  -+  ECA       74      0                                                      +----------+--------+--------+--------+------------------+-----------------  -+   +----------+--------+--------+----------------+-------------------+            PSV cm/sEDV cm/sDescribe        Arm Pressure (mmHG)  +----------+--------+--------+----------------+-------------------+  [id number]             Multiphasic, WNL                      +----------+--------+--------+----------------+-------------------+   +---------+--------+--+--------+--+---------+  VertebralPSV cm/s69EDV cm/s11Antegrade  +---------+--------+--+--------+--+---------+  Summary:  Right Carotid: Velocities in the right ICA are consistent with a 40-59%                 stenosis.   Left Carotid: Velocities in the left ICA are consistent with a 40-59%  stenosis;                However, this appears to be due to tortuosity vs. disease.   Vertebrals:  Bilateral vertebral arteries demonstrate antegrade flow.  Subclavians: Normal flow hemodynamics were seen in bilateral subclavian               arteries.   ASSESSMENT/PLAN: Asymptomatic Carotid stenosis The right carotid shows a slight increase in stenosis going from 41 EDV to 45 EDV placing her in the 40-59% range.  The left remains in the same category.    She is very active and maintains a healthy weight with good diet.  We reviewed signs and symptoms of stroke/TIA.  Advised her to seek immediate medical attention for the symptoms.   She will f/u in 1 year for continued surveillance with carotid duplex.      Roxy Horseman PA-C [facility] Office: [phone]  MD in clinic [place]

## 2021-06-21 ENCOUNTER — Other Ambulatory Visit: Payer: Self-pay

## 2021-06-21 ENCOUNTER — Ambulatory Visit
Admission: RE | Admit: 2021-06-21 | Discharge: 2021-06-21 | Disposition: A | Discharge status: Home / Self Care | Payer: Medicare Other | Source: Ambulatory Visit | Attending: Internal Medicine | Admitting: Internal Medicine

## 2021-06-21 DIAGNOSIS — Z1231 Encounter for screening mammogram for malignant neoplasm of breast: Secondary | ICD-10-CM | POA: Diagnosis not present

## 2021-08-16 DIAGNOSIS — H26491 Other secondary cataract, right eye: Secondary | ICD-10-CM | POA: Diagnosis not present

## 2021-08-26 DIAGNOSIS — Z23 Encounter for immunization: Secondary | ICD-10-CM | POA: Diagnosis not present

## 2021-12-05 ENCOUNTER — Encounter: Payer: Self-pay | Admitting: Internal Medicine

## 2021-12-05 ENCOUNTER — Ambulatory Visit (INDEPENDENT_AMBULATORY_CARE_PROVIDER_SITE_OTHER): Payer: Medicare Other | Admitting: Internal Medicine

## 2021-12-05 ENCOUNTER — Other Ambulatory Visit: Payer: Self-pay

## 2021-12-05 VITALS — BP 139/84 | HR 78 | Ht 67.0 in | Wt 142.6 lb

## 2021-12-05 DIAGNOSIS — I739 Peripheral vascular disease, unspecified: Secondary | ICD-10-CM | POA: Diagnosis not present

## 2021-12-05 DIAGNOSIS — I10 Essential (primary) hypertension: Secondary | ICD-10-CM | POA: Diagnosis not present

## 2021-12-05 NOTE — Progress Notes (Signed)
? ? ?Date:  12/05/2021  ? ?Name:  Shelia Taylor   DOB:  [birth year]   MRN:  [id number] ? ? ?Chief Complaint: Hypertension ? ?Hypertension ?This is a chronic problem. The current episode started more than 1 year ago. The problem is unchanged. The problem is controlled (fluctuating at home modestly 110-140 syst). Pertinent negatives include no chest pain, headaches, palpitations, peripheral edema or shortness of breath. There are no associated agents to hypertension. Past treatments include ACE inhibitors. The current treatment provides moderate improvement. There are no compliance problems.  Hypertensive end-organ damage includes kidney disease (elevated Cr last visit) and PVD. There is no history of CAD/MI or CVA.  ? ?Lab Results  ?Component Value Date  ? NA 138 06/07/2021  ? K 5.1 06/07/2021  ? CO2 20 06/07/2021  ? GLUCOSE 95 06/07/2021  ? BUN 26 06/07/2021  ? CREATININE 1.14 (H) 06/07/2021  ? CALCIUM 10.0 06/07/2021  ? EGFR 51 (L) 06/07/2021  ? GFRNONAA 52 (L) 11/15/2020  ? ?Lab Results  ?Component Value Date  ? CHOL 186 06/07/2021  ? HDL 119 06/07/2021  ? [place] 52 06/07/2021  ? TRIG 89 06/07/2021  ? CHOLHDL 1.6 06/07/2021  ? ?Lab Results  ?Component Value Date  ? TSH 1.440 06/07/2021  ? ?No results found for: HGBA1C ?Lab Results  ?Component Value Date  ? WBC 6.1 06/07/2021  ? HGB 13.2 06/07/2021  ? HCT 39.5 06/07/2021  ? MCV 97 06/07/2021  ? PLT 269 06/07/2021  ? ?Lab Results  ?Component Value Date  ? ALT 29 06/07/2021  ? AST 30 06/07/2021  ? ALKPHOS 62 06/07/2021  ? BILITOT 0.4 06/07/2021  ? ?Lab Results  ?Component Value Date  ? VD25OH 34.9 06/07/2021  ?  ? ?Review of Systems  ?Constitutional:  Negative for chills, fatigue and unexpected weight change.  ?HENT:  Negative for nosebleeds.   ?Eyes:  Negative for visual disturbance.  ?Respiratory:  Negative for cough, chest tightness, shortness of breath and wheezing.   ?Cardiovascular:  Negative for chest pain, palpitations and leg swelling.   ?Gastrointestinal:  Negative for abdominal pain, constipation and diarrhea.  ?Genitourinary:  Negative for frequency and urgency.  ?Neurological:  Negative for dizziness, weakness and headaches. Light-headedness: at times when standing. ?Psychiatric/Behavioral:  Negative for dysphoric mood and sleep disturbance. The patient is not nervous/anxious.   ? ?Patient Active Problem List  ? Diagnosis Date Noted  ? PAD (peripheral artery disease) ([place]) 12/05/2021  ? Essential hypertension 05/20/2019  ? Mixed hyperlipidemia 05/20/2019  ? Bilateral carotid artery stenosis 05/20/2019  ? Colon cancer screening 05/20/2019  ? Family history of colon cancer 05/20/2019  ? Endometrial polyp   ? Osteoporosis of multiple sites   ? ? ?Allergies  ?Allergen Reactions  ? Propofol Other (See Comments)  ?  Altered Mental Status after propofol given prior to colonoscopy on 07/11/2019, per MD documentation "likely not seizure".   ? ? ?Past Surgical History:  ?Procedure Laterality Date  ? APPENDECTOMY    ? BREAST EXCISIONAL BIOPSY Left 1970's  ? NEG  ? CATARACT EXTRACTION, BILATERAL  2017  ? DILATION AND CURETTAGE OF UTERUS    ? TUBAL LIGATION    ? ? ?Social History  ? ?Tobacco Use  ? Smoking status: Never  ? Smokeless tobacco: Never  ?Vaping Use  ? Vaping Use: Never used  ?Substance Use Topics  ? Alcohol use: Yes  ?  Alcohol/week: 14.0 standard drinks  ?  Types: 14 Glasses of wine per week  ?  Comment: 2 glasses per day  ? Drug use: Never  ? ? ? ?Medication list has been reviewed and updated. ? ?Current Meds  ?Medication Sig  ? aspirin 81 MG tablet Take 81 mg by mouth every other day.   ? Calcium Carbonate-Vitamin D (CALCIUM + D PO) Take by mouth 2 (two) times daily.  ? enalapril (VASOTEC) 5 MG tablet TAKE 1 TABLET DAILY  ? metroNIDAZOLE (METROCREAM) 0.75 % cream Apply topically 2 (two) times daily.  ? Multiple Vitamin (MULTIVITAMIN) tablet Take 1 tablet by mouth daily.  ? rosuvastatin (CRESTOR) 10 MG tablet TAKE 1 TABLET BY MOUTH daily   ? ? ?PHQ 2/9 Scores 12/05/2021 06/07/2021 05/27/2021 12/19/2020  ?PHQ - 2 Score 0 0 0 0  ?PHQ- 9 Score 0 1 - 0  ? ? ?GAD 7 : Generalized Anxiety Score 12/05/2021 06/07/2021 12/19/2020 11/15/2020  ?Nervous, Anxious, on Edge 0 0 0 0  ?Control/stop worrying 0 0 0 0  ?Worry too much - different things 0 0 0 0  ?Trouble relaxing 0 0 0 0  ?Restless 0 0 0 0  ?Easily annoyed or irritable 0 0 0 0  ?Afraid - awful might happen 0 0 0 0  ?Total GAD 7 Score 0 0 0 0  ?Anxiety Difficulty Not difficult at all - - -  ? ? ?BP Readings from Last 3 Encounters:  ?12/05/21 139/84  ?06/17/21 125/62  ?06/07/21 110/64  ? ? ?Physical Exam ?Vitals and nursing note reviewed.  ?Constitutional:   ?   General: She is not in acute distress. ?   Appearance: Normal appearance. She is well-developed.  ?HENT:  ?   Head: Normocephalic and atraumatic.  ?Cardiovascular:  ?   Rate and Rhythm: Normal rate and regular rhythm.  ?   Pulses: Normal pulses.  ?   Heart sounds: No murmur heard. ?Pulmonary:  ?   Effort: Pulmonary effort is normal. No respiratory distress.  ?   Breath sounds: No wheezing or rhonchi.  ?Musculoskeletal:  ?   Cervical back: Normal range of motion.  ?   Right lower leg: No edema.  ?   Left lower leg: No edema.  ?Lymphadenopathy:  ?   Cervical: No cervical adenopathy.  ?Skin: ?   General: Skin is warm and dry.  ?   Findings: No rash.  ?Neurological:  ?   General: No focal deficit present.  ?   Mental Status: She is alert and oriented to person, place, and time.  ?Psychiatric:     ?   Mood and Affect: Mood normal.     ?   Behavior: Behavior normal.  ? ? ?Wt Readings from Last 3 Encounters:  ?12/05/21 142 lb 9.6 oz (64.7 kg)  ?06/17/21 139 lb (63 kg)  ?06/07/21 136 lb (61.7 kg)  ? ? ?BP 139/84 (BP Location: Right Arm, Cuff Size: Normal)   Pulse 78   Ht 5' 7"  (1.702 m)   Wt 142 lb 9.6 oz (64.7 kg)   SpO2 97%   BMI 22.33 kg/m?  ? ?Assessment and Plan: ?1. Essential hypertension ?BP is fairly well controlled on Enalapril 5 mg. ?Continue healthy  diet, low sodium and exercise ?Increase fluid intake to 80 oz per day. ?- Basic metabolic panel ? ?2. PAD (peripheral artery disease) ([place]) ?Stable, continue regular exercise ? ? ?Partially dictated using Editor, commissioning. Any errors are unintentional. ? ?Halina Maidens, MD ?[facility] ?[facility] ? ?12/05/2021 ? ? ? ? ? ?

## 2021-12-06 LAB — BASIC METABOLIC PANEL
BUN/Creatinine Ratio: 20 (ref 12–28)
BUN: 26 mg/dL (ref 8–27)
CO2: 20 mmol/L (ref 20–29)
Calcium: 9.7 mg/dL (ref 8.7–10.3)
Chloride: 100 mmol/L (ref 96–106)
Creatinine, Ser: 1.27 mg/dL — ABNORMAL HIGH (ref 0.57–1.00)
Glucose: 101 mg/dL — ABNORMAL HIGH (ref 70–99)
Potassium: 4.7 mmol/L (ref 3.5–5.2)
Sodium: 138 mmol/L (ref 134–144)
eGFR: 44 mL/min/{1.73_m2} — ABNORMAL LOW (ref >59)

## 2021-12-11 ENCOUNTER — Encounter: Payer: Self-pay | Admitting: Cardiovascular Disease

## 2021-12-11 ENCOUNTER — Ambulatory Visit (INDEPENDENT_AMBULATORY_CARE_PROVIDER_SITE_OTHER): Payer: Medicare Other | Admitting: Cardiovascular Disease

## 2021-12-11 ENCOUNTER — Other Ambulatory Visit: Payer: Self-pay

## 2021-12-11 VITALS — BP 140/64 | HR 88 | Ht 67.0 in | Wt 143.0 lb

## 2021-12-11 DIAGNOSIS — I6523 Occlusion and stenosis of bilateral carotid arteries: Secondary | ICD-10-CM

## 2021-12-11 DIAGNOSIS — I10 Essential (primary) hypertension: Secondary | ICD-10-CM | POA: Diagnosis not present

## 2021-12-11 DIAGNOSIS — I739 Peripheral vascular disease, unspecified: Secondary | ICD-10-CM

## 2021-12-11 DIAGNOSIS — E782 Mixed hyperlipidemia: Secondary | ICD-10-CM

## 2021-12-11 DIAGNOSIS — I479 Paroxysmal tachycardia, unspecified: Secondary | ICD-10-CM

## 2021-12-11 MED ORDER — ROSUVASTATIN CALCIUM 20 MG PO TABS: ORAL_TABLET | ORAL | 3 refills | Status: DC

## 2021-12-11 MED ORDER — METOPROLOL SUCCINATE ER 25 MG PO TB24: 25.0000 mg | ORAL_TABLET | Freq: Every day | ORAL | 3 refills | Status: DC

## 2021-12-11 NOTE — Progress Notes (Addendum)
Cardiology Office Note ? ?Date:  12/11/2021  ? ?ID:  Shelia Taylor, DOB Oct 16, 1945, MRN [id number] ? ?PCP:  Glean Hess, MD  ? ?Chief Complaint  ?Patient presents with  ? Follow-up  ?  Patient c/o shortness of breath and rapid heartbeats for the past two months. Medication reviewed by the patient verbally.   ? ? ?HPI:  ?Shelia Taylor is a 76 year old woman with a past medical history of ?PAD, followed by vascular in [place] ?carotid stenosis 40 to 59% disease bilaterally,  ?Hypertension ?Hyperlipidemia ?Who presents for follow-up of her palpitations, hyperlipidemia, PAD ? ?Last seen in clinic April 2022 ?Reports having tachycardia in the morning ?30 to 45 min heart racing ?Sometimes later in the day when playing golf ?Active at baseline does regular exercise, golfing ?Water aerobics 3 days a week ? ?No chest pain concerning for angina ? ?Prior event monitor reviewed from 2022 ?Images pulled up and discussed rare short SVT ?Very rare episodes of tachycardia lasting several seconds ?No patient triggered events recorded ? ?Carotid ultrasound reviewed ?Right Carotid: Velocities in the right ICA are consistent with a 40-59% stenosis.  ?Left Carotid: Velocities in the left ICA are consistent with a 40-59%  ?stenosis ? ?EKG personally reviewed by myself on todays visit ?Shows normal sinus rhythm with rate 88 bpm no significant ST-T wave changes ? ?PMH:   has a past medical history of Allergy (07/19/19), DUB (dysfunctional uterine bleeding), DUB (dysfunctional uterine bleeding), Endometrial polyp, Hypertension, Mixed hyperlipidemia, Motion sickness, and Osteoporosis. ? ?PSH:    ?Past Surgical History:  ?Procedure Laterality Date  ? APPENDECTOMY    ? BREAST EXCISIONAL BIOPSY Left 1970's  ? NEG  ? CATARACT EXTRACTION, BILATERAL  2017  ? DILATION AND CURETTAGE OF UTERUS    ? TUBAL LIGATION    ? ? ?Current Outpatient Medications  ?Medication Sig Dispense Refill  ? aspirin 81 MG tablet Take 81 mg by mouth  every other day.     ? Calcium Carbonate-Vitamin D (CALCIUM + D PO) Take by mouth 2 (two) times daily.    ? enalapril (VASOTEC) 5 MG tablet TAKE 1 TABLET DAILY 90 tablet 3  ? metroNIDAZOLE (METROCREAM) 0.75 % cream Apply topically 2 (two) times daily.    ? Multiple Vitamin (MULTIVITAMIN) tablet Take 1 tablet by mouth daily.    ? rosuvastatin (CRESTOR) 10 MG tablet TAKE 1 TABLET BY MOUTH daily 90 tablet 3  ? ?No current facility-administered medications for this visit.  ? ? ?Allergies:   Propofol  ? ?Social History:  The patient  reports that she has never smoked. She has never used smokeless tobacco. She reports current alcohol use of about 14.0 standard drinks per week. She reports that she does not use drugs.  ? ?Family History:   family history includes Breast cancer in her cousin; Cancer in her father; Diabetes in her mother and sister; Hypertension in her father.  ? ? ?Review of Systems: ?Review of Systems  ?Constitutional: Negative.   ?HENT: Negative.    ?Respiratory: Negative.    ?Cardiovascular:  Positive for palpitations.  ?Gastrointestinal: Negative.   ?Musculoskeletal: Negative.   ?Neurological: Negative.   ?Psychiatric/Behavioral: Negative.    ?All other systems reviewed and are negative. ? ?PHYSICAL EXAM: ?VS:  BP 140/64 (BP Location: Left Arm, Patient Position: Sitting, Cuff Size: Normal)   Pulse 88   Ht '5\' 7"'$  (1.702 m)   Wt 143 lb (64.9 kg)   SpO2 97%   BMI 22.40 kg/m?  , BMI  Body mass index is 22.4 kg/m?Marland Kitchen ?Constitutional:  oriented to person, place, and time. No distress.  ?HENT:  ?Head: Grossly normal ?Eyes:  no discharge. No scleral icterus.  ?Neck: No JVD, 1+ carotid bruit bilaterally ?Cardiovascular: Regular rate and rhythm, no murmurs appreciated ?Pulmonary/Chest: Clear to auscultation bilaterally, no wheezes or rails ?Abdominal: Soft.  no distension.  no tenderness.  ?Musculoskeletal: Normal range of motion ?Neurological:  normal muscle tone. Coordination normal. No atrophy ?Skin: Skin  warm and dry ?Psychiatric: normal affect, pleasant ? ?Recent Labs: ?06/07/2021: ALT 29; Hemoglobin 13.2; Platelets 269; TSH 1.440 ?12/05/2021: BUN 26; Creatinine, Ser 1.27; Potassium 4.7; Sodium 138  ? ? ?Lipid Panel ?Lab Results  ?Component Value Date  ? CHOL 186 06/07/2021  ? HDL 119 06/07/2021  ? [place] 52 06/07/2021  ? TRIG 89 06/07/2021  ? ? ?Wt Readings from Last 3 Encounters:  ?12/11/21 143 lb (64.9 kg)  ?12/05/21 142 lb 9.6 oz (64.7 kg)  ?06/17/21 139 lb (63 kg)  ?  ? ?ASSESSMENT AND PLAN: ? ?Problem List Items Addressed This Visit   ? ?  ? Cardiology Problems  ? PAD (peripheral artery disease) ([place]) - Primary  ? Essential hypertension  ? Mixed hyperlipidemia  ? Bilateral carotid artery stenosis  ? ?Paroxysmal tachycardia ?Reports having elevated heart rate in the mornings, lasting 45 minutes or so ?Prior event monitor, no significant arrhythmia apart from 2 very short runs of SVT ?Recommend she stop enalapril, start metoprolol succinate 25 mg in the evening ?Titration up of the metoprolol as needed for symptom relief ?Could reconsider Zio monitor if symptoms do not improve ?Home monitoring of heart rate also might be of benefit ? ?Essential hypertension ?Holding enalapril as above, starting metoprolol succinate ?If blood pressure runs high may need to restart enalapril or go up on the metoprolol ? ?Hyperlipidemia ?Total cholesterol above goal, recommend total cholesterol less than 150 ?She is willing to try Crestor 20 daily ?We did discuss Zetia ? ?PAD/carotid stenosis ?Moderate disease discussed, bruits bilaterally ?Ultrasound reviewed ? ? Total encounter time more than 30 minutes ? Greater than 50% was spent in counseling and coordination of care with the patient ? ? ? ?Signed, ?Esmond Plants, M.D., Ph.D. ?[facility] [place], [place] ?[phone] ?

## 2021-12-11 NOTE — Patient Instructions (Addendum)
Medication Instructions:  ?Please increase the crestor up to 20 mg daily ? ?Please stop enalapril ?Please start  ?metoprolol succinate 25 mg daily at dinner ? ? ?If you need a refill on your cardiac medications before your next appointment, please call your pharmacy.  ? ?Lab work: ?No new labs needed ? ?Testing/Procedures: ?No new testing needed ? ?Follow-Up: ?At [facility], you and your health needs are our priority.  As part of our continuing mission to provide you with exceptional heart care, we have created designated Provider Care Teams.  These Care Teams include your primary Cardiologist (physician) and Advanced Practice Providers (APPs -  Physician Assistants and Nurse Practitioners) who all work together to provide you with the care you need, when you need it. ? ?You will need a follow up appointment in 12 months ? ?Providers on your designated Care Team:   ?Murray Hodgkins, NP ?Christell Faith, PA-C ?Cadence Kathlen Mody, PA-C ? ?COVID-19 Vaccine Information can be found at: [email or web address] For questions related to vaccine distribution or appointments, please email [email or web address] or call [phone].  ? ?

## 2022-03-21 DIAGNOSIS — D1801 Hemangioma of skin and subcutaneous tissue: Secondary | ICD-10-CM | POA: Diagnosis not present

## 2022-03-21 DIAGNOSIS — L821 Other seborrheic keratosis: Secondary | ICD-10-CM | POA: Diagnosis not present

## 2022-03-21 DIAGNOSIS — L718 Other rosacea: Secondary | ICD-10-CM | POA: Diagnosis not present

## 2022-05-27 NOTE — Progress Notes (Unsigned)
Subjective:   Shelia Taylor is a 76 y.o. female who presents for Medicare Annual (Subsequent) preventive examination.  I connected with  Shelia Taylor on 05/28/22 by an in person visit and verified that I am speaking with the correct person using two identifiers.  Patient Location: Other:  In Office  Provider Location: Office/Clinic  I discussed the limitations of evaluation and management by telemedicine. The patient expressed understanding and agreed to proceed.   Review of Systems    Defer to PCP Cardiac Risk Factors include: advanced age (>13mn, >>47women)     Objective:    Today's Vitals   05/28/22 0909 05/28/22 0918  BP:  132/74  Pulse:  65  SpO2:  99%  Weight: 145 lb (65.8 kg) 145 lb (65.8 kg)  Height: '5\' 7"'$  (1.702 m) '5\' 7"'$  (1.702 m)  PainSc: 0-No pain 0-No pain   Body mass index is 22.71 kg/m.     05/28/2022    9:21 AM 05/27/2021    9:33 AM 05/23/2020    9:27 AM 07/11/2019   12:11 PM 07/11/2019   10:00 AM 03/22/2018    8:36 AM  Advanced Directives  Does Patient Have a Medical Advance Directive? No No No No No No  Does patient want to make changes to medical advance directive?     No - Patient declined   Would patient like information on creating a medical advance directive? No - Patient declined No - Patient declined No - Patient declined   No - Patient declined    Current Medications (verified) Outpatient Encounter Medications as of 05/28/2022  Medication Sig   aspirin 81 MG tablet Take 81 mg by mouth every other day.    Calcium Carbonate-Vitamin D (CALCIUM + D PO) Take by mouth 2 (two) times daily.   metoprolol succinate (TOPROL XL) 25 MG 24 hr tablet Take 1 tablet (25 mg total) by mouth daily with supper.   metroNIDAZOLE (METROCREAM) 0.75 % cream Apply topically 2 (two) times daily.   Multiple Vitamin (MULTIVITAMIN) tablet Take 1 tablet by mouth daily.   rosuvastatin (CRESTOR) 20 MG tablet TAKE 1 TABLET BY MOUTH DAILY   No  facility-administered encounter medications on file as of 05/28/2022.    Allergies (verified) Propofol   History: Past Medical History:  Diagnosis Date   Allergy 07/19/19   DUB (dysfunctional uterine bleeding)    DUB (dysfunctional uterine bleeding)    Endometrial polyp    Hypertension    Mixed hyperlipidemia    Motion sickness    back seat cars   Osteoporosis    Past Surgical History:  Procedure Laterality Date   APPENDECTOMY     BREAST EXCISIONAL BIOPSY Left 1970's   NEG   CATARACT EXTRACTION, BILATERAL  2017   DILATION AND CURETTAGE OF UTERUS     TUBAL LIGATION     Family History  Problem Relation Age of Onset   Diabetes Mother    Hypertension Father    Cancer Father        colon cancer   Diabetes Sister    Breast cancer Cousin        Paternal 1st cousin   Social History   Socioeconomic History   Marital status: Widowed    Spouse name: Not on file   Number of children: 0   Years of education: Not on file   Highest education level: Not on file  Occupational History   Not on file  Tobacco Use   Smoking status:  Never   Smokeless tobacco: Never  Vaping Use   Vaping Use: Never used  Substance and Sexual Activity   Alcohol use: Yes    Alcohol/week: 14.0 standard drinks of alcohol    Types: 14 Glasses of wine per week    Comment: 2 glasses per day   Drug use: Never   Sexual activity: Not Currently    Birth control/protection: Surgical  Other Topics Concern   Not on file  Social History Narrative   Pt lives alone   Social Determinants of Health   Financial Resource Strain: Low Risk  (05/28/2022)   Overall Financial Resource Strain (CARDIA)    Difficulty of Paying Living Expenses: Not hard at all  Food Insecurity: No Food Insecurity (05/28/2022)   Hunger Vital Sign    Worried About Running Out of Food in the Last Year: Never true    Ran Out of Food in the Last Year: Never true  Transportation Needs: No Transportation Needs (05/28/2022)   PRAPARE -  Hydrologist (Medical): No    Lack of Transportation (Non-Medical): No  Physical Activity: Sufficiently Active (05/28/2022)   Exercise Vital Sign    Days of Exercise per Week: 5 days    Minutes of Exercise per Session: 90 min  Stress: No Stress Concern Present (05/28/2022)   [place]    Feeling of Stress : Not at all  Social Connections: Socially Isolated (05/27/2021)   Social Connection and Isolation Panel [NHANES]    Frequency of Communication with Friends and Family: More than three times a week    Frequency of Social Gatherings with Friends and Family: Three times a week    Attends Religious Services: Never    Active Member of Clubs or Organizations: No    Attends Archivist Meetings: Never    Marital Status: Widowed    Tobacco Counseling Patient is a non-smoker. No counseling needed.   Clinical Intake:  Pre-visit preparation completed: Yes  Pain : No/denies pain Pain Score: 0-No pain     BMI - recorded: 22.71 Nutritional Status: BMI of 19-24  Normal Nutritional Risks: None Diabetes: No     Diabetic? No.  Interpreter Needed?: No  Information entered by :: Shelia Taylor, CMA   Activities of Daily Living    05/28/2022    9:22 AM 12/05/2021   10:30 AM  In your present state of health, do you have any difficulty performing the following activities:  Hearing? 0 0  Vision? 0 0  Difficulty concentrating or making decisions? 0 0  Walking or climbing stairs? 0 0  Dressing or bathing? 0 0  Doing errands, shopping? 0 0  Preparing Food and eating ? N   Using the Toilet? N   In the past six months, have you accidently leaked urine? N   Do you have problems with loss of bowel control? N   Managing your Medications? N   Managing your Finances? N   Housekeeping or managing your Housekeeping? N     Patient Care Team: Shelia Hess, MD as PCP - General  (Internal Medicine) Shelia Mitchell, MD as Consulting Physician (Vascular Surgery) Shelia Rack, MD (Dermatology) Shelia Merritts, MD as Consulting Physician (Cardiology)  Indicate any recent Medical Services you may have received from other than [facility] providers in the past year (date may be approximate).     Assessment:   This is a routine wellness examination for  Shelia Taylor.  Hearing/Vision screen No concerns at this time.  Dietary issues and exercise activities discussed: Current Exercise Habits: Home exercise routine, Type of exercise: Other - see comments Psychologist, educational aerobics, and Golfing.), Time (Minutes): > 60, Frequency (Times/Week): 5, Weekly Exercise (Minutes/Week): 0, Intensity: Moderate, Exercise limited by: None identified   Goals Addressed             This Visit's Progress    Patient Stated   On track    Maintain health and physical activity level.       Depression Screen    05/28/2022    9:29 AM 12/05/2021   10:29 AM 06/07/2021    8:04 AM 05/27/2021    9:32 AM 12/19/2020   10:18 AM 11/15/2020   10:30 AM 05/23/2020    9:25 AM  PHQ 2/9 Scores  PHQ - 2 Score 0 0 0 0 0 0 0  PHQ- 9 Score 1 0 1  0 0     Fall Risk    05/28/2022    9:21 AM 12/05/2021   10:29 AM 06/07/2021    8:05 AM 05/27/2021    9:33 AM 12/19/2020   10:18 AM  Fall Risk   Falls in the past year? 0 0 0 0 0  Number falls in past yr: 0 0 0 0   Injury with Fall? 0 0 0 0   Risk for fall due to : No Fall Risks No Fall Risks No Fall Risks No Fall Risks   Follow up Falls evaluation completed Falls evaluation completed Falls evaluation completed Falls prevention discussed Falls evaluation completed    FALL RISK PREVENTION PERTAINING TO THE HOME:  Any stairs in or around the home? Yes  Garage into the house. If so, are there any without handrails? Yes  Home free of loose throw rugs in walkways, pet beds, electrical cords, etc? Yes  Adequate lighting in your home to reduce risk of falls? Yes   ASSISTIVE  DEVICES UTILIZED TO PREVENT FALLS:  Life alert? No  Use of a cane, walker or w/c? No  Grab bars in the bathroom? Yes  Shower chair or bench in shower? Yes  Elevated toilet seat or a handicapped toilet? Yes   TIMED UP AND GO:  Was the test performed? Yes .   Gait steady and fast without use of assistive device  Cognitive Function:        05/28/2022    9:22 AM 05/23/2020    9:29 AM 05/20/2019    2:37 PM  6CIT Screen  What Year? 0 points 0 points 0 points  What month? 0 points 0 points 0 points  What time? 0 points 0 points 0 points  Count back from 20 0 points 0 points 0 points  Months in reverse 0 points 0 points 0 points  Repeat phrase 0 points 0 points 0 points  Total Score 0 points 0 points 0 points    Immunizations Immunization History  Administered Date(s) Administered   Fluad Quad(high Dose 65+) 08/26/2021   Influenza, High Dose Seasonal PF 08/13/2018   Influenza-Unspecified 07/28/2019, 07/06/2020   PFIZER Comirnaty(Gray Top)Covid-19 Tri-Sucrose Vaccine 03/05/2021   PFIZER(Purple Top)SARS-COV-2 Vaccination 02/03/2020, 03/06/2020, 10/02/2020   Pneumococcal Conjugate-13 01/31/2015   Pneumococcal Polysaccharide-23 08/07/1997, 11/15/2012   Td 09/05/2008    TDAP status: Due, Education has been provided regarding the importance of this vaccine. Advised may receive this vaccine at local pharmacy or Health Dept. Aware to provide a copy of the vaccination record if obtained from  local pharmacy or Health Dept. Verbalized acceptance and understanding.  Flu Vaccine status: Due, Education has been provided regarding the importance of this vaccine. Advised may receive this vaccine at local pharmacy or Health Dept. Aware to provide a copy of the vaccination record if obtained from local pharmacy or Health Dept. Verbalized acceptance and understanding.  Pneumococcal vaccine status: Up to date  Covid-19 vaccine status: Completed vaccines  Qualifies for Shingles Vaccine? Yes    Zostavax completed No   Shingrix Completed?: No.    Education has been provided regarding the importance of this vaccine. Patient has been advised to call insurance company to determine out of pocket expense if they have not yet received this vaccine. Advised may also receive vaccine at local pharmacy or Health Dept. Verbalized acceptance and understanding.  Screening Tests Health Maintenance  Topic Date Due   Zoster Vaccines- Shingrix (1 of 2) Never done   TETANUS/TDAP  09/05/2018   COLONOSCOPY (Pts 45-34yr Insurance coverage will need to be confirmed)  11/14/2019   INFLUENZA VACCINE  05/06/2022   COVID-19 Vaccine (5 - Pfizer series) 06/13/2022 (Originally 04/30/2021)   MAMMOGRAM  06/21/2022   Pneumonia Vaccine 75 Years old  Completed   DEXA SCAN  Completed   Hepatitis C Screening  Completed   HPV VACCINES  Aged Out   PAP SMEAR-Modifier  Discontinued    Health Maintenance  Health Maintenance Due  Topic Date Due   Zoster Vaccines- Shingrix (1 of 2) Never done   TETANUS/TDAP  09/05/2018   COLONOSCOPY (Pts 45-429yrInsurance coverage will need to be confirmed)  11/14/2019   INFLUENZA VACCINE  05/06/2022   Colonoscopy completed: 05/13/2010. Repeat every 10 years. Patient due.  Mammogram status: Completed 06/21/2021. Repeat every year  Bone Density status: Completed 06/20/2020. Results reflect: Bone density results: OSTEOPOROSIS. Repeat every 2-3 years.  Lung Cancer Screening: (Low Dose CT Chest recommended if Age 75-80ears, 30 pack-year currently smoking OR have quit w/in 15years.) does not qualify.   Lung Cancer Screening Referral: N/A  Additional Screening:  Hepatitis C Screening: does qualify; Completed 05/31/2020.  Vision Screening: Recommended annual ophthalmology exams for early detection of glaucoma and other disorders of the eye. Is the patient up to date with their annual eye exam?  Yes  Who is the provider or what is the name of the office in which the patient  attends annual eye exams? [facility] is not established with a provider, would they like to be referred to a provider to establish care? No .   Dental Screening: Recommended annual dental exams for proper oral hygiene  Community Resource Referral / Chronic Care Management: CRR required this visit?  No   CCM required this visit?  No      Plan:     I have personally reviewed and noted the following in the patient's chart:   Medical and social history Use of alcohol, tobacco or illicit drugs  Current medications and supplements including opioid prescriptions. Patient is not currently taking opioid prescriptions. Functional ability and status Nutritional status Physical activity Advanced directives List of other physicians Hospitalizations, surgeries, and ER visits in previous 12 months Vitals Screenings to include cognitive, depression, and falls Referrals and appointments  In addition, I have reviewed and discussed with patient certain preventive protocols, quality metrics, and best practice recommendations. A written personalized care plan for preventive services as well as general preventive health recommendations were provided to patient.     ChClista BernhardtCMA  05/28/2022   Nurse Notes: None.   Ms. Donahoo , Thank you for taking time to come for your Medicare Wellness Visit. I appreciate your ongoing commitment to your health goals. Please review the following plan we discussed and let me know if I can assist you in the future.   These are the goals we discussed:  Goals      Patient Stated     Maintain health and physical activity level.         This is a list of the screening recommended for you and due dates:  Health Maintenance  Topic Date Due   Zoster (Shingles) Vaccine (1 of 2) Never done   Tetanus Vaccine  09/05/2018   Colon Cancer Screening  11/14/2019   Flu Shot  05/06/2022   COVID-19 Vaccine (5 - Pfizer series) 06/13/2022*    Mammogram  06/21/2022   Pneumonia Vaccine  Completed   DEXA scan (bone density measurement)  Completed   Hepatitis C Screening: USPSTF Recommendation to screen - Ages 64-79 yo.  Completed   HPV Vaccine  Aged Out   Pap Smear  Discontinued  *Topic was postponed. The date shown is not the original due date.

## 2022-05-28 ENCOUNTER — Ambulatory Visit (INDEPENDENT_AMBULATORY_CARE_PROVIDER_SITE_OTHER): Payer: Medicare Other

## 2022-05-28 VITALS — BP 132/74 | HR 65 | Ht 67.0 in | Wt 145.0 lb

## 2022-05-28 DIAGNOSIS — Z0000 Encounter for general adult medical examination without abnormal findings: Secondary | ICD-10-CM | POA: Diagnosis not present

## 2022-05-28 NOTE — Patient Instructions (Signed)
Ms. Lose , Thank you for taking time to come for your Medicare Wellness Visit. I appreciate your ongoing commitment to your health goals. Please review the following plan we discussed and let me know if I can assist you in the future.   These are the goals we discussed:  Goals      Patient Stated     Maintain health and physical activity level.         This is a list of the screening recommended for you and due dates:  Health Maintenance  Topic Date Due   Zoster (Shingles) Vaccine (1 of 2) Never done   Tetanus Vaccine  09/05/2018   Colon Cancer Screening  11/14/2019   Flu Shot  05/06/2022   COVID-19 Vaccine (5 - Pfizer series) 06/13/2022*   Mammogram  06/21/2022   Pneumonia Vaccine  Completed   DEXA scan (bone density measurement)  Completed   Hepatitis C Screening: USPSTF Recommendation to screen - Ages 35-79 yo.  Completed   HPV Vaccine  Aged Out   Pap Smear  Discontinued  *Topic was postponed. The date shown is not the original due date.

## 2022-06-11 ENCOUNTER — Encounter: Payer: Self-pay | Admitting: Internal Medicine

## 2022-06-11 ENCOUNTER — Ambulatory Visit (INDEPENDENT_AMBULATORY_CARE_PROVIDER_SITE_OTHER): Payer: Medicare Other | Admitting: Internal Medicine

## 2022-06-11 VITALS — BP 138/62 | HR 74 | Ht 67.0 in | Wt 145.0 lb

## 2022-06-11 DIAGNOSIS — I10 Essential (primary) hypertension: Secondary | ICD-10-CM | POA: Diagnosis not present

## 2022-06-11 DIAGNOSIS — I479 Paroxysmal tachycardia, unspecified: Secondary | ICD-10-CM

## 2022-06-11 DIAGNOSIS — Z1231 Encounter for screening mammogram for malignant neoplasm of breast: Secondary | ICD-10-CM

## 2022-06-11 DIAGNOSIS — I6523 Occlusion and stenosis of bilateral carotid arteries: Secondary | ICD-10-CM

## 2022-06-11 DIAGNOSIS — E782 Mixed hyperlipidemia: Secondary | ICD-10-CM | POA: Diagnosis not present

## 2022-06-11 DIAGNOSIS — Z1211 Encounter for screening for malignant neoplasm of colon: Secondary | ICD-10-CM | POA: Diagnosis not present

## 2022-06-11 NOTE — Progress Notes (Signed)
Date:  06/11/2022   Name:  Shelia Taylor   DOB:  04/25/1946   MRN:  [id number]   Chief Complaint: Annual Exam and Hypertension Shelia Taylor is a 76 y.o. female who presents today for her Complete Annual Exam. She feels fairly well. She reports exercising golf and water aerobics 2-3 times a week. She reports she is sleeping well. Breast complaints none.  Mammogram: 06/2021 DEXA: 06/2020 osteopenia Pap smear: discontinued Colonoscopy: 11/2009  Health Maintenance Due  Topic Date Due   Zoster Vaccines- Shingrix (1 of 2) Never done   TETANUS/TDAP  09/05/2018    Immunization History  Administered Date(s) Administered   Fluad Quad(high Dose 65+) 08/26/2021   Influenza, High Dose Seasonal PF 08/13/2018   Influenza-Unspecified 07/28/2019, 07/06/2020   PFIZER Comirnaty(Gray Top)Covid-19 Tri-Sucrose Vaccine 03/05/2021   PFIZER(Purple Top)SARS-COV-2 Vaccination 02/03/2020, 03/06/2020, 10/02/2020   Pneumococcal Conjugate-13 01/31/2015   Pneumococcal Polysaccharide-23 08/07/1997, 11/15/2012   Td 09/05/2008    Hypertension This is a chronic problem. The problem is controlled. Pertinent negatives include no chest pain, headaches, palpitations or shortness of breath. Past treatments include beta blockers (stopped enalapril due to SVT and started beta blocker). Hypertensive end-organ damage includes kidney disease. There is no history of CAD/MI or CVA.  Hyperlipidemia This is a chronic problem. The problem is controlled. Pertinent negatives include no chest pain or shortness of breath. Current antihyperlipidemic treatment includes statins.    Lab Results  Component Value Date   NA 138 12/05/2021   K 4.7 12/05/2021   CO2 20 12/05/2021   GLUCOSE 101 (H) 12/05/2021   BUN 26 12/05/2021   CREATININE 1.27 (H) 12/05/2021   CALCIUM 9.7 12/05/2021   EGFR 44 (L) 12/05/2021   GFRNONAA 52 (L) 11/15/2020   Lab Results  Component Value Date   CHOL 186 06/07/2021   HDL 119  06/07/2021   LDLCALC 52 06/07/2021   TRIG 89 06/07/2021   CHOLHDL 1.6 06/07/2021   Lab Results  Component Value Date   TSH 1.440 06/07/2021   No results found for: "HGBA1C" Lab Results  Component Value Date   WBC 6.1 06/07/2021   HGB 13.2 06/07/2021   HCT 39.5 06/07/2021   MCV 97 06/07/2021   PLT 269 06/07/2021   Lab Results  Component Value Date   ALT 29 06/07/2021   AST 30 06/07/2021   ALKPHOS 62 06/07/2021   BILITOT 0.4 06/07/2021   Lab Results  Component Value Date   VD25OH 34.9 06/07/2021     Review of Systems  Constitutional:  Negative for chills, fatigue and fever.  HENT:  Negative for congestion, hearing loss, tinnitus, trouble swallowing and voice change.   Eyes:  Negative for visual disturbance.  Respiratory:  Negative for cough, chest tightness, shortness of breath and wheezing.   Cardiovascular:  Negative for chest pain, palpitations and leg swelling.  Gastrointestinal:  Negative for abdominal pain, constipation, diarrhea and vomiting.  Endocrine: Negative for polydipsia and polyuria.  Genitourinary:  Negative for dysuria, frequency, genital sores, vaginal bleeding and vaginal discharge.  Musculoskeletal:  Negative for arthralgias, gait problem and joint swelling.  Skin:  Negative for color change and rash.  Neurological:  Negative for dizziness, tremors, light-headedness and headaches.  Hematological:  Negative for adenopathy. Does not bruise/bleed easily.  Psychiatric/Behavioral:  Negative for dysphoric mood and sleep disturbance. The patient is not nervous/anxious.     Patient Active Problem List   Diagnosis Date Noted   Paroxysmal tachycardia (HCC) 06/11/2022   PAD (peripheral  artery disease) ([place]) 12/05/2021   Essential hypertension 05/20/2019   Mixed hyperlipidemia 05/20/2019   Bilateral carotid artery stenosis 05/20/2019   Family history of colon cancer 05/20/2019   Endometrial polyp    Osteoporosis of multiple sites     Allergies   Allergen Reactions   Propofol Other (See Comments)    Altered Mental Status after propofol given prior to colonoscopy on 07/11/2019, per MD documentation "likely not seizure".     Past Surgical History:  Procedure Laterality Date   APPENDECTOMY     BREAST EXCISIONAL BIOPSY Left 1970's   NEG   CATARACT EXTRACTION, BILATERAL  2017   DILATION AND CURETTAGE OF UTERUS     TUBAL LIGATION      Social History   Tobacco Use   Smoking status: Never   Smokeless tobacco: Never  Vaping Use   Vaping Use: Never used  Substance Use Topics   Alcohol use: Yes    Alcohol/week: 14.0 standard drinks of alcohol    Types: 14 Glasses of wine per week    Comment: 2 glasses per day   Drug use: Never     Medication list has been reviewed and updated.  Current Meds  Medication Sig   aspirin 81 MG tablet Take 81 mg by mouth every other day.    Calcium Carbonate-Vitamin D (CALCIUM + D PO) Take by mouth 2 (two) times daily.   metoprolol succinate (TOPROL XL) 25 MG 24 hr tablet Take 1 tablet (25 mg total) by mouth daily with supper.   metroNIDAZOLE (METROCREAM) 0.75 % cream Apply topically 2 (two) times daily.   Multiple Vitamin (MULTIVITAMIN) tablet Take 1 tablet by mouth daily.   rosuvastatin (CRESTOR) 20 MG tablet TAKE 1 TABLET BY MOUTH DAILY       06/11/2022    8:44 AM 05/28/2022    9:29 AM 12/05/2021   10:29 AM 06/07/2021    8:04 AM  GAD 7 : Generalized Anxiety Score  Nervous, Anxious, on Edge 0 0 0 0  Control/stop worrying 0 0 0 0  Worry too much - different things 0 0 0 0  Trouble relaxing 0 0 0 0  Restless 0 0 0 0  Easily annoyed or irritable 0 0 0 0  Afraid - awful might happen 0 0 0 0  Total GAD 7 Score 0 0 0 0  Anxiety Difficulty Not difficult at all Not difficult at all Not difficult at all        06/11/2022    8:44 AM 05/28/2022    9:29 AM 12/05/2021   10:29 AM  Depression screen PHQ 2/9  Decreased Interest 0 0 0  Down, Depressed, Hopeless 0 0 0  PHQ - 2 Score 0 0 0  Altered  sleeping 2 1 0  Tired, decreased energy 0 0 0  Change in appetite 0 0 0  Feeling bad or failure about yourself  0 0 0  Trouble concentrating 0 0 0  Moving slowly or fidgety/restless 0 0 0  Suicidal thoughts 0 0 0  PHQ-9 Score 2 1 0  Difficult doing work/chores Not difficult at all Not difficult at all Not difficult at all    BP Readings from Last 3 Encounters:  06/11/22 138/62  05/28/22 132/74  12/11/21 140/64    Physical Exam Vitals and nursing note reviewed.  Constitutional:      General: She is not in acute distress.    Appearance: She is well-developed.  HENT:     Head: Normocephalic  and atraumatic.     Right Ear: Tympanic membrane and ear canal normal.     Left Ear: Tympanic membrane and ear canal normal.     Nose:     Right Sinus: No maxillary sinus tenderness.     Left Sinus: No maxillary sinus tenderness.  Eyes:     General: No scleral icterus.       Right eye: No discharge.        Left eye: No discharge.     Conjunctiva/sclera: Conjunctivae normal.  Neck:     Thyroid: No thyromegaly.     Vascular: No carotid bruit.  Cardiovascular:     Rate and Rhythm: Normal rate and regular rhythm.     Pulses: Normal pulses.     Heart sounds: Normal heart sounds.  Pulmonary:     Effort: Pulmonary effort is normal. No respiratory distress.     Breath sounds: No wheezing.  Chest:  Breasts:    Right: No mass, nipple discharge, skin change or tenderness.     Left: No mass, nipple discharge, skin change or tenderness.  Abdominal:     General: Bowel sounds are normal.     Palpations: Abdomen is soft.     Tenderness: There is no abdominal tenderness.  Musculoskeletal:     Cervical back: Normal range of motion. No erythema.     Right lower leg: No edema.     Left lower leg: No edema.  Lymphadenopathy:     Cervical: No cervical adenopathy.  Skin:    General: Skin is warm and dry.     Findings: No rash.  Neurological:     Mental Status: She is alert and oriented to  person, place, and time.     Cranial Nerves: No cranial nerve deficit.     Sensory: No sensory deficit.     Deep Tendon Reflexes: Reflexes are normal and symmetric.  Psychiatric:        Attention and Perception: Attention normal.        Mood and Affect: Mood normal.     Wt Readings from Last 3 Encounters:  06/11/22 145 lb (65.8 kg)  05/28/22 145 lb (65.8 kg)  12/11/21 143 lb (64.9 kg)    BP 138/62 (BP Location: Right Arm, Cuff Size: Normal)   Pulse 74   Ht $R'5\' 7"'KY$  (1.702 m)   Wt 145 lb (65.8 kg)   SpO2 96%   BMI 22.71 kg/m   Assessment and Plan: 1. Essential hypertension Clinically stable exam with well controlled BP. Tolerating medications without side effects at this time. Pt to continue current regimen and low sodium diet; benefits of regular exercise as able discussed. - CBC with Differential/Platelet - Comprehensive metabolic panel - TSH - Urinalysis, Routine w reflex microscopic  2. Encounter for screening mammogram for breast cancer To be scheduled at [facility]  3. Paroxysmal tachycardia ([place]) Now on beta blocker and doing well Monitored by Cardiology.  4. Mixed hyperlipidemia Tolerating statin medication without side effects at this time LDL is at goal of < 70 on current dose Continue same therapy without change at this time. - Lipid panel  5. Colon cancer screening She declines further screenings  6. Bilateral carotid artery stenosis Seeing VS in [facility] but wants a specialist closer to home. Will refer to [facility] - Ambulatory referral to Vascular Surgery   Partially dictated using Dragon software. Any errors are unintentional.  Halina Maidens, MD [facility]  06/11/2022     

## 2022-06-12 LAB — COMPREHENSIVE METABOLIC PANEL
ALT: 51 IU/L — ABNORMAL HIGH (ref 0–32)
AST: 54 IU/L — ABNORMAL HIGH (ref 0–40)
Albumin/Globulin Ratio: 1.8 (ref 1.2–2.2)
Albumin: 5 g/dL — ABNORMAL HIGH (ref 3.8–4.8)
Alkaline Phosphatase: 66 IU/L (ref 44–121)
BUN/Creatinine Ratio: 16 (ref 12–28)
BUN: 18 mg/dL (ref 8–27)
Bilirubin Total: 0.5 mg/dL (ref 0.0–1.2)
CO2: 19 mmol/L — ABNORMAL LOW (ref 20–29)
Calcium: 10.4 mg/dL — ABNORMAL HIGH (ref 8.7–10.3)
Chloride: 101 mmol/L (ref 96–106)
Creatinine, Ser: 1.1 mg/dL — ABNORMAL HIGH (ref 0.57–1.00)
Globulin, Total: 2.8 g/dL (ref 1.5–4.5)
Glucose: 85 mg/dL (ref 70–99)
Potassium: 4.8 mmol/L (ref 3.5–5.2)
Sodium: 139 mmol/L (ref 134–144)
Total Protein: 7.8 g/dL (ref 6.0–8.5)
eGFR: 53 mL/min/{1.73_m2} — ABNORMAL LOW (ref >59)

## 2022-06-12 LAB — CBC WITH DIFFERENTIAL/PLATELET
Basophils Absolute: 0 10*3/uL (ref 0.0–0.2)
Basos: 0 %
EOS (ABSOLUTE): 0.1 10*3/uL (ref 0.0–0.4)
Eos: 2 %
Hematocrit: 42.1 % (ref 34.0–46.6)
Hemoglobin: 14.3 g/dL (ref 11.1–15.9)
Immature Grans (Abs): 0 10*3/uL (ref 0.0–0.1)
Immature Granulocytes: 0 %
Lymphocytes Absolute: 1.6 10*3/uL (ref 0.7–3.1)
Lymphs: 22 %
MCH: 33 pg (ref 26.6–33.0)
MCHC: 34 g/dL (ref 31.5–35.7)
MCV: 97 fL (ref 79–97)
Monocytes Absolute: 0.8 10*3/uL (ref 0.1–0.9)
Monocytes: 11 %
Neutrophils Absolute: 4.6 10*3/uL (ref 1.4–7.0)
Neutrophils: 65 %
Platelets: 285 10*3/uL (ref 150–450)
RBC: 4.33 x10E6/uL (ref 3.77–5.28)
RDW: 13 % (ref 11.7–15.4)
WBC: 7.2 10*3/uL (ref 3.4–10.8)

## 2022-06-12 LAB — LIPID PANEL
Chol/HDL Ratio: 1.6 ratio (ref 0.0–4.4)
Cholesterol, Total: 184 mg/dL (ref 100–199)
HDL: 118 mg/dL (ref >39)
LDL Chol Calc (NIH): 54 mg/dL (ref 0–99)
Triglycerides: 60 mg/dL (ref 0–149)
VLDL Cholesterol Cal: 12 mg/dL (ref 5–40)

## 2022-06-12 LAB — URINALYSIS, ROUTINE W REFLEX MICROSCOPIC
Bilirubin, UA: NEGATIVE
Glucose, UA: NEGATIVE
Leukocytes,UA: NEGATIVE
Nitrite, UA: NEGATIVE
RBC, UA: NEGATIVE
Specific Gravity, UA: 1.025 (ref 1.005–1.030)
Urobilinogen, Ur: 0.2 mg/dL (ref 0.2–1.0)
pH, UA: 5.5 (ref 5.0–7.5)

## 2022-06-12 LAB — MICROSCOPIC EXAMINATION
Bacteria, UA: NONE SEEN
Casts: NONE SEEN /lpf
RBC, Urine: NONE SEEN /hpf (ref 0–2)

## 2022-06-12 LAB — TSH: TSH: 0.936 u[IU]/mL (ref 0.450–4.500)

## 2022-07-09 ENCOUNTER — Ambulatory Visit
Admission: RE | Admit: 2022-07-09 | Discharge: 2022-07-09 | Disposition: A | Discharge status: Home / Self Care | Payer: Medicare Other | Source: Ambulatory Visit | Attending: Internal Medicine | Admitting: Internal Medicine

## 2022-07-09 DIAGNOSIS — Z1231 Encounter for screening mammogram for malignant neoplasm of breast: Secondary | ICD-10-CM | POA: Diagnosis not present

## 2022-07-24 ENCOUNTER — Other Ambulatory Visit (INDEPENDENT_AMBULATORY_CARE_PROVIDER_SITE_OTHER): Payer: Self-pay | Admitting: Nurse Practitioner

## 2022-07-24 DIAGNOSIS — I6523 Occlusion and stenosis of bilateral carotid arteries: Secondary | ICD-10-CM

## 2022-07-25 ENCOUNTER — Ambulatory Visit (INDEPENDENT_AMBULATORY_CARE_PROVIDER_SITE_OTHER): Payer: Self-pay

## 2022-07-25 ENCOUNTER — Ambulatory Visit (INDEPENDENT_AMBULATORY_CARE_PROVIDER_SITE_OTHER): Payer: Self-pay | Admitting: Nurse Practitioner

## 2022-07-25 ENCOUNTER — Encounter (INDEPENDENT_AMBULATORY_CARE_PROVIDER_SITE_OTHER): Payer: Self-pay | Admitting: Nurse Practitioner

## 2022-07-25 VITALS — BP 166/68 | HR 72 | Resp 16 | Wt 150.0 lb

## 2022-07-25 DIAGNOSIS — I10 Essential (primary) hypertension: Secondary | ICD-10-CM

## 2022-07-25 DIAGNOSIS — I6523 Occlusion and stenosis of bilateral carotid arteries: Secondary | ICD-10-CM

## 2022-07-25 DIAGNOSIS — E782 Mixed hyperlipidemia: Secondary | ICD-10-CM

## 2022-08-03 ENCOUNTER — Encounter (INDEPENDENT_AMBULATORY_CARE_PROVIDER_SITE_OTHER): Payer: Self-pay | Admitting: Nurse Practitioner

## 2022-08-03 NOTE — Progress Notes (Signed)
Subjective:    Patient ID: Shelia Taylor, female    DOB: 10-29-1945, 76 y.o.   MRN: [id number] Chief Complaint  Patient presents with   New Patient (Initial Visit)    Ref     The patient is seen for follow up evaluation of carotid stenosis. The carotid stenosis followed by ultrasound.  She was previously followed at the [facility] vein and vascular office however our office is more convenient for her location wise.  The patient denies amaurosis fugax. There is no recent history of TIA symptoms or focal motor deficits. There is no prior documented CVA.  The patient is taking enteric-coated aspirin 81 mg daily.  There is no history of migraine headaches. There is no history of seizures.  No recent shortening of the patient's walking distance or new symptoms consistent with claudication.  No history of rest pain symptoms. No new ulcers or wounds of the lower extremities have occurred.  There is no history of DVT, PE or superficial thrombophlebitis. No recent episodes of angina or shortness of breath documented.   Carotid Duplex done today shows 40 to 59% stenosis of the right ICA with minimal stenosis of the left ICA.  Previous study showed 40 to 59% stenosis of the right ICA but also 40 to 59% stenosis of the left ICA as well.  The bilateral vertebral arteries have antegrade flow with normal flow hemodynamics in the bilateral subclavian arteries.    Review of Systems  All other systems reviewed and are negative.      Objective:   Physical Exam Vitals reviewed.  HENT:     Head: Normocephalic.  Neck:     Vascular: No carotid bruit.  Cardiovascular:     Rate and Rhythm: Normal rate.     Pulses: Normal pulses.  Pulmonary:     Effort: Pulmonary effort is normal.  Skin:    General: Skin is warm and dry.  Neurological:     Mental Status: She is alert and oriented to person, place, and time.  Psychiatric:        Mood and Affect: Mood normal.        Behavior: Behavior  normal.        Thought Content: Thought content normal.        Judgment: Judgment normal.     BP (!) 166/68 (BP Location: Right Arm)   Pulse 72   Resp 16   Wt 150 lb (68 kg)   BMI 23.49 kg/m   Past Medical History:  Diagnosis Date   Allergy 07/19/19   DUB (dysfunctional uterine bleeding)    DUB (dysfunctional uterine bleeding)    Endometrial polyp    Hypertension    Mixed hyperlipidemia    Motion sickness    back seat cars   Osteoporosis     Social History   Socioeconomic History   Marital status: Widowed    Spouse name: Not on file   Number of children: 0   Years of education: Not on file   Highest education level: Not on file  Occupational History   Not on file  Tobacco Use   Smoking status: Never   Smokeless tobacco: Never  Vaping Use   Vaping Use: Never used  Substance and Sexual Activity   Alcohol use: Yes    Alcohol/week: 14.0 standard drinks of alcohol    Types: 14 Glasses of wine per week    Comment: 2 glasses per day   Drug use: Never   Sexual activity:  Not Currently    Birth control/protection: Surgical  Other Topics Concern   Not on file  Social History Narrative   Pt lives alone   Social Determinants of Health   Financial Resource Strain: Low Risk  (06/11/2022)   Overall Financial Resource Strain (CARDIA)    Difficulty of Paying Living Expenses: Not hard at all  Food Insecurity: No Food Insecurity (06/11/2022)   Hunger Vital Sign    Worried About Running Out of Food in the Last Year: Never true    Ran Out of Food in the Last Year: Never true  Transportation Needs: No Transportation Needs (06/11/2022)   PRAPARE - Hydrologist (Medical): No    Lack of Transportation (Non-Medical): No  Physical Activity: Sufficiently Active (05/28/2022)   Exercise Vital Sign    Days of Exercise per Week: 5 days    Minutes of Exercise per Session: 90 min  Stress: No Stress Concern Present (05/28/2022)   [place]    Feeling of Stress : Not at all  Social Connections: Socially Isolated (05/27/2021)   Social Connection and Isolation Panel [NHANES]    Frequency of Communication with Friends and Family: More than three times a week    Frequency of Social Gatherings with Friends and Family: Three times a week    Attends Religious Services: Never    Active Member of Clubs or Organizations: No    Attends Archivist Meetings: Never    Marital Status: Widowed  Intimate Partner Violence: Not At Risk (06/11/2022)   Humiliation, Afraid, Rape, and Kick questionnaire    Fear of Current or Ex-Partner: No    Emotionally Abused: No    Physically Abused: No    Sexually Abused: No    Past Surgical History:  Procedure Laterality Date   APPENDECTOMY     BREAST EXCISIONAL BIOPSY Left 1970's   NEG   CATARACT EXTRACTION, BILATERAL  2017   DILATION AND CURETTAGE OF UTERUS     TUBAL LIGATION      Family History  Problem Relation Age of Onset   Diabetes Mother    Hypertension Father    Cancer Father        colon cancer   Diabetes Sister    Breast cancer Cousin        Paternal 1st cousin    Allergies  Allergen Reactions   Propofol Other (See Comments)    Altered Mental Status after propofol given prior to colonoscopy on 07/11/2019, per MD documentation "likely not seizure".        Latest Ref Rng & Units 06/11/2022    9:40 AM 06/07/2021    8:31 AM 05/31/2020   10:08 AM  CBC  WBC 3.4 - 10.8 x10E3/uL 7.2  6.1  5.5   Hemoglobin 11.1 - 15.9 g/dL 14.3  13.2  13.6   Hematocrit 34.0 - 46.6 % 42.1  39.5  39.9   Platelets 150 - 450 x10E3/uL 285  269  238       CMP     Component Value Date/Time   NA 139 06/11/2022 0940   K 4.8 06/11/2022 0940   CL 101 06/11/2022 0940   CO2 19 (L) 06/11/2022 0940   GLUCOSE 85 06/11/2022 0940   GLUCOSE 103 (H) 07/11/2019 1237   BUN 18 06/11/2022 0940   CREATININE 1.10 (H) 06/11/2022 0940   CALCIUM 10.4 (H)  06/11/2022 0940   PROT 7.8 06/11/2022 0940  ALBUMIN 5.0 (H) 06/11/2022 0940   AST 54 (H) 06/11/2022 0940   ALT 51 (H) 06/11/2022 0940   ALKPHOS 66 06/11/2022 0940   BILITOT 0.5 06/11/2022 0940   GFRNONAA 52 (L) 11/15/2020 1129   GFRAA 60 11/15/2020 1129     No results found.     Assessment & Plan:   1. Bilateral carotid artery stenosis Recommend:  Given the patient's asymptomatic subcritical stenosis no further invasive testing or surgery at this time.  Duplex ultrasound shows 40 to 59% of the ICA  Continue antiplatelet therapy as prescribed Continue management of CAD, HTN and Hyperlipidemia Healthy heart diet,  encouraged exercise at least 4 times per week Follow up in 12 months with duplex ultrasound and physical exam    2. Essential hypertension Continue antihypertensive medications as already ordered, these medications have been reviewed and there are no changes at this time.   3. Mixed hyperlipidemia Continue statin as ordered and reviewed, no changes at this time    Current Outpatient Medications on File Prior to Visit  Medication Sig Dispense Refill   aspirin 81 MG tablet Take 81 mg by mouth every other day.      Calcium Carbonate-Vitamin D (CALCIUM + D PO) Take by mouth 2 (two) times daily.     metoprolol succinate (TOPROL XL) 25 MG 24 hr tablet Take 1 tablet (25 mg total) by mouth daily with supper. 90 tablet 3   metroNIDAZOLE (METROCREAM) 0.75 % cream Apply topically 2 (two) times daily.     Multiple Vitamin (MULTIVITAMIN) tablet Take 1 tablet by mouth daily.     rosuvastatin (CRESTOR) 20 MG tablet TAKE 1 TABLET BY MOUTH DAILY 90 tablet 3   No current facility-administered medications on file prior to visit.    There are no Patient Instructions on file for this visit. No follow-ups on file.   Kris Hartmann, NP

## 2022-08-04 ENCOUNTER — Encounter (INDEPENDENT_AMBULATORY_CARE_PROVIDER_SITE_OTHER): Payer: Self-pay

## 2022-08-22 DIAGNOSIS — H26491 Other secondary cataract, right eye: Secondary | ICD-10-CM | POA: Diagnosis not present

## 2022-11-03 ENCOUNTER — Other Ambulatory Visit: Payer: Self-pay | Admitting: *Deleted

## 2022-11-03 MED ORDER — ROSUVASTATIN CALCIUM 20 MG PO TABS: ORAL_TABLET | ORAL | 3 refills | Status: DC

## 2022-11-03 MED ORDER — METOPROLOL SUCCINATE ER 25 MG PO TB24: 25.0000 mg | ORAL_TABLET | Freq: Every day | ORAL | 3 refills | Status: DC

## 2022-12-10 ENCOUNTER — Ambulatory Visit (INDEPENDENT_AMBULATORY_CARE_PROVIDER_SITE_OTHER): Payer: Medicare PPO | Admitting: Internal Medicine

## 2022-12-10 ENCOUNTER — Encounter: Payer: Self-pay | Admitting: Internal Medicine

## 2022-12-10 VITALS — BP 136/64 | HR 75 | Ht 67.0 in | Wt 149.0 lb

## 2022-12-10 DIAGNOSIS — E782 Mixed hyperlipidemia: Secondary | ICD-10-CM | POA: Diagnosis not present

## 2022-12-10 DIAGNOSIS — I6523 Occlusion and stenosis of bilateral carotid arteries: Secondary | ICD-10-CM | POA: Diagnosis not present

## 2022-12-10 DIAGNOSIS — I10 Essential (primary) hypertension: Secondary | ICD-10-CM

## 2022-12-10 DIAGNOSIS — R944 Abnormal results of kidney function studies: Secondary | ICD-10-CM

## 2022-12-10 NOTE — Assessment & Plan Note (Signed)
Tolerating statin medications without concerns LDL is  Lab Results  Component Value Date   LDLCALC 54 06/11/2022  with a goal of < 70.

## 2022-12-10 NOTE — Assessment & Plan Note (Addendum)
Clinically stable exam with moderately well controlled BP on metoprolol 25 mg. Reading at home > 130 at least half the time. Tolerating medications without side effects. Will increase to 37.5 mg daily - record BP and call if not improved or other issues

## 2022-12-10 NOTE — Progress Notes (Signed)
Date:  12/10/2022   Name:  Shelia Taylor   DOB:  10-27-1945   MRN:  [id number]   Chief Complaint: Hypertension  Hypertension This is a chronic problem. The problem is controlled. Pertinent negatives include no chest pain, headaches, palpitations or shortness of breath. Past treatments include beta blockers. The current treatment provides significant improvement. Hypertensive end-organ damage includes kidney disease. There is no history of CAD/MI or CVA.   CAS - noted on [place] - seen by VS and advised no surgery until stenosis > 80%.  On aspirin.  Lab Results  Component Value Date   NA 139 06/11/2022   K 4.8 06/11/2022   CO2 19 (L) 06/11/2022   GLUCOSE 85 06/11/2022   BUN 18 06/11/2022   CREATININE 1.10 (H) 06/11/2022   CALCIUM 10.4 (H) 06/11/2022   EGFR 53 (L) 06/11/2022   GFRNONAA 52 (L) 11/15/2020   Lab Results  Component Value Date   CHOL 184 06/11/2022   HDL 118 06/11/2022   LDLCALC 54 06/11/2022   TRIG 60 06/11/2022   CHOLHDL 1.6 06/11/2022   Lab Results  Component Value Date   TSH 0.936 06/11/2022   No results found for: "HGBA1C" Lab Results  Component Value Date   WBC 7.2 06/11/2022   HGB 14.3 06/11/2022   HCT 42.1 06/11/2022   MCV 97 06/11/2022   PLT 285 06/11/2022   Lab Results  Component Value Date   ALT 51 (H) 06/11/2022   AST 54 (H) 06/11/2022   ALKPHOS 66 06/11/2022   BILITOT 0.5 06/11/2022   Lab Results  Component Value Date   VD25OH 34.9 06/07/2021     Review of Systems  Constitutional:  Negative for fatigue and unexpected weight change.  HENT:  Negative for nosebleeds.   Eyes:  Negative for visual disturbance.  Respiratory:  Negative for cough, chest tightness, shortness of breath and wheezing.   Cardiovascular:  Negative for chest pain, palpitations and leg swelling.  Gastrointestinal:  Negative for abdominal pain, constipation and diarrhea.  Neurological:  Negative for dizziness, weakness, light-headedness and headaches.     Patient Active Problem List   Diagnosis Date Noted   Paroxysmal tachycardia ([place]) 06/11/2022   PAD (peripheral artery disease) ([place]) 12/05/2021   Essential hypertension 05/20/2019   Mixed hyperlipidemia 05/20/2019   Bilateral carotid artery stenosis 05/20/2019   Family history of colon cancer 05/20/2019   Endometrial polyp    Osteoporosis of multiple sites     Allergies  Allergen Reactions   Propofol Other (See Comments)    Altered Mental Status after propofol given prior to colonoscopy on 07/11/2019, per MD documentation "likely not seizure".     Past Surgical History:  Procedure Laterality Date   APPENDECTOMY     BREAST EXCISIONAL BIOPSY Left 1970's   NEG   CATARACT EXTRACTION, BILATERAL  2017   DILATION AND CURETTAGE OF UTERUS     TUBAL LIGATION      Social History   Tobacco Use   Smoking status: Never   Smokeless tobacco: Never  Vaping Use   Vaping Use: Never used  Substance Use Topics   Alcohol use: Yes    Alcohol/week: 14.0 standard drinks of alcohol    Types: 14 Glasses of wine per week    Comment: 2 glasses per day   Drug use: Never     Medication list has been reviewed and updated.  Current Meds  Medication Sig   aspirin 81 MG tablet Take 81 mg by mouth every  other day.    Calcium Carbonate-Vitamin D (CALCIUM + D PO) Take by mouth 2 (two) times daily.   metoprolol succinate (TOPROL XL) 25 MG 24 hr tablet Take 1 tablet (25 mg total) by mouth daily with supper.   metroNIDAZOLE (METROCREAM) 0.75 % cream Apply topically 2 (two) times daily.   Multiple Vitamin (MULTIVITAMIN) tablet Take 1 tablet by mouth daily.   rosuvastatin (CRESTOR) 20 MG tablet TAKE 1 TABLET BY MOUTH DAILY       12/10/2022    8:22 AM 06/11/2022    8:44 AM 05/28/2022    9:29 AM 12/05/2021   10:29 AM  GAD 7 : Generalized Anxiety Score  Nervous, Anxious, on Edge 0 0 0 0  Control/stop worrying 0 0 0 0  Worry too much - different things 0 0 0 0  Trouble relaxing 0 0 0 0  Restless 0  0 0 0  Easily annoyed or irritable 0 0 0 0  Afraid - awful might happen 0 0 0 0  Total GAD 7 Score 0 0 0 0  Anxiety Difficulty Not difficult at all Not difficult at all Not difficult at all Not difficult at all       12/10/2022    8:22 AM 06/11/2022    8:44 AM 05/28/2022    9:29 AM  Depression screen PHQ 2/9  Decreased Interest  0 0  Down, Depressed, Hopeless 0 0 0  PHQ - 2 Score 0 0 0  Altered sleeping '1 2 1  '$ Tired, decreased energy 0 0 0  Change in appetite 0 0 0  Feeling bad or failure about yourself  0 0 0  Trouble concentrating 0 0 0  Moving slowly or fidgety/restless 0 0 0  Suicidal thoughts 0 0 0  PHQ-9 Score '1 2 1  '$ Difficult doing work/chores Not difficult at all Not difficult at all Not difficult at all    BP Readings from Last 3 Encounters:  12/10/22 136/64  07/25/22 (!) 166/68  06/11/22 138/62    Physical Exam Vitals and nursing note reviewed.  Constitutional:      General: She is not in acute distress.    Appearance: She is well-developed.  HENT:     Head: Normocephalic and atraumatic.  Pulmonary:     Effort: Pulmonary effort is normal. No respiratory distress.  Skin:    General: Skin is warm and dry.     Findings: No rash.  Neurological:     Mental Status: She is alert and oriented to person, place, and time.  Psychiatric:        Mood and Affect: Mood normal.        Behavior: Behavior normal.     Wt Readings from Last 3 Encounters:  12/10/22 149 lb (67.6 kg)  07/25/22 150 lb (68 kg)  06/11/22 145 lb (65.8 kg)    BP 136/64   Pulse 75   Ht '5\' 7"'$  (1.702 m)   Wt 149 lb (67.6 kg)   SpO2 98%   BMI 23.34 kg/m   Assessment and Plan: Problem List Items Addressed This Visit       Cardiovascular and Mediastinum   Bilateral carotid artery stenosis (Chronic)    Asymptomatic 50% stenosis - no complaints Tolerating statin well Continue ASA      Essential hypertension - Primary (Chronic)    Clinically stable exam with moderately well controlled  BP on metoprolol 25 mg. Reading at home > 130 at least half the time. Tolerating medications without  side effects. Will increase to 37.5 mg daily - record BP and call if not improved or other issues       Relevant Orders   Comprehensive metabolic panel     Other   Mixed hyperlipidemia (Chronic)    Tolerating statin medications without concerns LDL is  Lab Results  Component Value Date   LDLCALC 54 06/11/2022  with a goal of < 70.        Relevant Orders   Comprehensive metabolic panel   Other Visit Diagnoses     Decreased GFR       Relevant Orders   Comprehensive metabolic panel        Partially dictated using Dragon software. Any errors are unintentional.  Halina Maidens, MD [facility]  12/10/2022

## 2022-12-10 NOTE — Patient Instructions (Signed)
Take Toprol 25 mg - 1 and 1/2 daily.  Call if there are any problems.

## 2022-12-10 NOTE — Assessment & Plan Note (Signed)
Asymptomatic 50% stenosis - no complaints Tolerating statin well Continue ASA

## 2022-12-11 LAB — COMPREHENSIVE METABOLIC PANEL
ALT: 40 IU/L — ABNORMAL HIGH (ref 0–32)
AST: 39 IU/L (ref 0–40)
Albumin/Globulin Ratio: 1.8 (ref 1.2–2.2)
Albumin: 4.8 g/dL (ref 3.8–4.8)
Alkaline Phosphatase: 70 IU/L (ref 44–121)
BUN/Creatinine Ratio: 20 (ref 12–28)
BUN: 21 mg/dL (ref 8–27)
Bilirubin Total: 0.5 mg/dL (ref 0.0–1.2)
CO2: 20 mmol/L (ref 20–29)
Calcium: 10.1 mg/dL (ref 8.7–10.3)
Chloride: 105 mmol/L (ref 96–106)
Creatinine, Ser: 1.03 mg/dL — ABNORMAL HIGH (ref 0.57–1.00)
Globulin, Total: 2.6 g/dL (ref 1.5–4.5)
Glucose: 100 mg/dL — ABNORMAL HIGH (ref 70–99)
Potassium: 4.3 mmol/L (ref 3.5–5.2)
Sodium: 142 mmol/L (ref 134–144)
Total Protein: 7.4 g/dL (ref 6.0–8.5)
eGFR: 57 mL/min/{1.73_m2} — ABNORMAL LOW (ref >59)

## 2023-01-28 ENCOUNTER — Ambulatory Visit: Payer: Medicare Other | Admitting: Cardiovascular Disease

## 2023-03-27 DIAGNOSIS — L718 Other rosacea: Secondary | ICD-10-CM | POA: Diagnosis not present

## 2023-03-27 DIAGNOSIS — D2271 Melanocytic nevi of right lower limb, including hip: Secondary | ICD-10-CM | POA: Diagnosis not present

## 2023-03-27 DIAGNOSIS — D2262 Melanocytic nevi of left upper limb, including shoulder: Secondary | ICD-10-CM | POA: Diagnosis not present

## 2023-03-27 DIAGNOSIS — L821 Other seborrheic keratosis: Secondary | ICD-10-CM | POA: Diagnosis not present

## 2023-03-27 DIAGNOSIS — L731 Pseudofolliculitis barbae: Secondary | ICD-10-CM | POA: Diagnosis not present

## 2023-03-27 DIAGNOSIS — D225 Melanocytic nevi of trunk: Secondary | ICD-10-CM | POA: Diagnosis not present

## 2023-03-27 DIAGNOSIS — D2261 Melanocytic nevi of right upper limb, including shoulder: Secondary | ICD-10-CM | POA: Diagnosis not present

## 2023-03-27 DIAGNOSIS — D2272 Melanocytic nevi of left lower limb, including hip: Secondary | ICD-10-CM | POA: Diagnosis not present

## 2023-03-29 NOTE — Progress Notes (Unsigned)
Cardiology Office Note  Date:  03/30/2023   ID:  Shelia Taylor, DOB [birth year], MRN [id number]  PCP:  Reubin Milan, MD   Chief Complaint  Patient presents with   12 month follow up     Patient c/o dizziness, had a syncopal spell 3 weeks ago & shortness of breath. Medications reviewed by the patient verbally.     HPI:  [name] is a 77 year old woman with a past medical history of PAD, followed by vascular in [facility] carotid stenosis 40 to 59% disease bilaterally,  Hypertension Hyperlipidemia Who presents for follow-up of her palpitations, hyperlipidemia, PAD  Last seen in clinic March 2023 Significant events from prior clinic visit, episode of syncope 3 weeks ago,  syncope on the driveway Reports that she was wheeling the garbage can back to the house, got back halfway when she acutely went down in the driveway Reports no presyncope symptoms preceding the event Was not dizzy, no tunnel vision, no GI issues, had no warning Passerby driving by helped her up Skinned left side of face Did not go to the ER, went back into the house to recover  Played golf that Am, feels that she was hydrated Event happened middle of the afternoon No other episodes  Reports that several weeks ago she started taking metoprolol succinate 37.5 mg every other day Low pressures on her measurements, lowest was 97 systolic, often 107 systolic Blood pressures have persisted even taking the medication every other day in fact it would appear days when she takes the medication night before pressure is lower the next morning less than 110 systolic  Orthostatics positive today pressure 160 down to 130 with standing heart rate 76 up to 84  On metoprolol in the past for tachycardia Otherwise reports that she is active Lives alone, continues to drive No chest pain concerning for angina  EKG personally reviewed by myself on todays visit Normal sinus rhythm rate 76 bpm left anterior  fascicular block no significant ST-T wave changes  Other past medical history reviewed Prior event monitor reviewed from 2022 Images pulled up and discussed rare short SVT Very rare episodes of tachycardia lasting several seconds No patient triggered events recorded  Carotid ultrasound reviewed Right Carotid: Velocities in the right ICA are consistent with a 40-59% stenosis.  Left Carotid: Velocities in the left ICA are consistent with a 40-59%  stenosis   PMH:   has a past medical history of Allergy (07/19/19), DUB (dysfunctional uterine bleeding), DUB (dysfunctional uterine bleeding), Endometrial polyp, Hypertension, Mixed hyperlipidemia, Motion sickness, and Osteoporosis.  PSH:    Past Surgical History:  Procedure Laterality Date   APPENDECTOMY     BREAST EXCISIONAL BIOPSY Left 1970's   NEG   CATARACT EXTRACTION, BILATERAL  2017   DILATION AND CURETTAGE OF UTERUS     TUBAL LIGATION      Current Outpatient Medications  Medication Sig Dispense Refill   aspirin 81 MG tablet Take 81 mg by mouth every other day.      Calcium Carbonate-Vitamin D (CALCIUM + D PO) Take by mouth 2 (two) times daily.     metoprolol succinate (TOPROL-XL) 25 MG 24 hr tablet Take 37.5 mg by mouth every other day.     metroNIDAZOLE (METROCREAM) 0.75 % cream Apply topically 2 (two) times daily.     Multiple Vitamin (MULTIVITAMIN) tablet Take 1 tablet by mouth daily.     rosuvastatin (CRESTOR) 20 MG tablet TAKE 1 TABLET BY MOUTH DAILY 90 tablet 3  No current facility-administered medications for this visit.    Allergies:   Propofol   Social History:  The patient  reports that she has never smoked. She has never used smokeless tobacco. She reports current alcohol use of about 14.0 standard drinks of alcohol per week. She reports that she does not use drugs.   Family History:   family history includes Breast cancer in her cousin; Cancer in her father; Diabetes in her mother and sister; Hypertension in  her father.    Review of Systems: Review of Systems  Constitutional: Negative.   HENT: Negative.    Respiratory: Negative.    Cardiovascular: Negative.   Gastrointestinal: Negative.   Musculoskeletal: Negative.   Neurological:  Positive for loss of consciousness.  Psychiatric/Behavioral: Negative.    All other systems reviewed and are negative.   PHYSICAL EXAM: VS:  BP (!) 152/82 (BP Location: Right Arm, Patient Position: Sitting, Cuff Size: Normal)   Pulse 76   Ht 5\' 7"  (1.702 m)   Wt 145 lb 2 oz (65.8 kg)   SpO2 98%   BMI 22.73 kg/m  , BMI Body mass index is 22.73 kg/m. Constitutional:  oriented to person, place, and time. No distress.  HENT:  Head: Grossly normal Eyes:  no discharge. No scleral icterus.  Neck: No JVD, 1+ carotid bruit bilaterally Cardiovascular: Regular rate and rhythm, no murmurs appreciated Pulmonary/Chest: Clear to auscultation bilaterally, no wheezes or rails Abdominal: Soft.  no distension.  no tenderness.  Musculoskeletal: Normal range of motion Neurological:  normal muscle tone. Coordination normal. No atrophy Skin: Skin warm and dry Psychiatric: normal affect, pleasant  Recent Labs: 06/11/2022: Hemoglobin 14.3; Platelets 285; TSH 0.936 12/10/2022: ALT 40; BUN 21; Creatinine, Ser 1.03; Potassium 4.3; Sodium 142    Lipid Panel Lab Results  Component Value Date   CHOL 184 06/11/2022   HDL 118 06/11/2022   LDLCALC 54 06/11/2022   TRIG 60 06/11/2022    Wt Readings from Last 3 Encounters:  03/30/23 145 lb 2 oz (65.8 kg)  12/10/22 149 lb (67.6 kg)  07/25/22 150 lb (68 kg)     ASSESSMENT AND PLAN:  Problem List Items Addressed This Visit       Cardiology Problems   Essential hypertension (Chronic)   Relevant Medications   metoprolol succinate (TOPROL-XL) 25 MG 24 hr tablet   Mixed hyperlipidemia (Chronic)   Relevant Medications   metoprolol succinate (TOPROL-XL) 25 MG 24 hr tablet   Bilateral carotid artery stenosis (Chronic)    Relevant Medications   metoprolol succinate (TOPROL-XL) 25 MG 24 hr tablet   PAD (peripheral artery disease) (HCC) - Primary (Chronic)   Relevant Medications   metoprolol succinate (TOPROL-XL) 25 MG 24 hr tablet   Paroxysmal tachycardia (HCC)   Relevant Medications   metoprolol succinate (TOPROL-XL) 25 MG 24 hr tablet  Paroxysmal tachycardia Zio monitor ordered given recent syncope Rather than taking metoprolol succinate 37.5 every other day we have recommended 25 every other day alternating with 12.5 mg If blood pressure runs low we may need to decrease down to 12.5 daily  Essential hypertension Plan as above, metoprolol 25 alternating with 12.5 She admitted to 37.5 every other day Blood pressures running low, often less than 110 30 point drop in blood pressure today with standing Recommend she stay hydrated For low pressures at home may need to decrease metoprolol down to 12.5 daily  Syncope Suspected secondary to orthostasis Recommend she stay hydrated, avoid playing golf in the heat Metoprolol changes as  above Close monitoring of pressure Echocardiogram and Zio monitor ordered  Hyperlipidemia Continue Crestor 20 daily  PAD/carotid stenosis Moderate disease discussed, bruits bilaterally Followed by Dr. Wyn Quaker  Long discussion concerning syncope Recommend she avoid driving until workup is complete  Total encounter time more than 40 minutes  Greater than 50% was spent in counseling and coordination of care with the patient    Signed, Dossie Arbour, M.D., Ph.D. [facility] [place], [place] [phone]

## 2023-03-30 ENCOUNTER — Encounter: Payer: Self-pay | Admitting: Cardiovascular Disease

## 2023-03-30 ENCOUNTER — Ambulatory Visit (INDEPENDENT_AMBULATORY_CARE_PROVIDER_SITE_OTHER): Payer: Medicare PPO

## 2023-03-30 ENCOUNTER — Ambulatory Visit: Payer: Medicare PPO | Attending: Cardiovascular Disease | Admitting: Cardiovascular Disease

## 2023-03-30 VITALS — BP 152/82 | HR 76 | Ht 67.0 in | Wt 145.1 lb

## 2023-03-30 DIAGNOSIS — I10 Essential (primary) hypertension: Secondary | ICD-10-CM

## 2023-03-30 DIAGNOSIS — I739 Peripheral vascular disease, unspecified: Secondary | ICD-10-CM

## 2023-03-30 DIAGNOSIS — E782 Mixed hyperlipidemia: Secondary | ICD-10-CM

## 2023-03-30 DIAGNOSIS — I6523 Occlusion and stenosis of bilateral carotid arteries: Secondary | ICD-10-CM | POA: Diagnosis not present

## 2023-03-30 DIAGNOSIS — R55 Syncope and collapse: Secondary | ICD-10-CM

## 2023-03-30 DIAGNOSIS — I479 Paroxysmal tachycardia, unspecified: Secondary | ICD-10-CM

## 2023-03-30 MED ORDER — METOPROLOL SUCCINATE ER 25 MG PO TB24: ORAL_TABLET | ORAL | 6 refills | Status: DC

## 2023-03-30 NOTE — Patient Instructions (Addendum)
Medication Instructions:  Metoprolol 25 alternating with 12.5 every other day If pressures run low (<110), we will decrease the metoprolol down to 12.5 mg daily  If you need a refill on your cardiac medications before your next appointment, please call your pharmacy.   Lab work: No new labs needed  Testing/Procedures:  Your physician has recommended that you wear a Zio monitor 14 days.   This monitor is a medical device that records the heart's electrical activity. Doctors most often use these monitors to diagnose arrhythmias. Arrhythmias are problems with the speed or rhythm of the heartbeat. The monitor is a small device applied to your chest. You can wear one while you do your normal daily activities. While wearing this monitor if you have any symptoms to push the button and record what you felt. Once you have worn this monitor for the period of time provider prescribed (Usually 14 days), you will return the monitor device in the postage paid box. Once it is returned they will download the data collected and provide [place] with a report which the provider will then review and we will call you with those results. Important tips:  Avoid showering during the first 24 hours of wearing the monitor. Avoid excessive sweating to help maximize wear time. Do not submerge the device, no hot tubs, and no swimming pools. Keep any lotions or oils away from the patch. After 24 hours you may shower with the patch on. Take brief showers with your back facing the shower head.  Do not remove patch once it has been placed because that will interrupt data and decrease adhesive wear time. Push the button when you have any symptoms and write down what you were feeling. Once you have completed wearing your monitor, remove and place into box which has postage paid and place in your outgoing mailbox.  If for some reason you have misplaced your box then call our office and we can provide another box and/or mail it off  for you.    Your physician has requested that you have an echocardiogram. Echocardiography is a painless test that uses sound waves to create images of your heart. It provides your doctor with information about the size and shape of your heart and how well your heart's chambers and valves are working.   You may receive an ultrasound enhancing agent through an IV if needed to better visualize your heart during the echo. This procedure takes approximately one hour.  There are no restrictions for this procedure.  This will take place at [address], [place] [address]    Follow-Up: At [facility], you and your health needs are our priority.  As part of our continuing mission to provide you with exceptional heart care, we have created designated Provider Care Teams.  These Care Teams include your primary Cardiologist (physician) and Advanced Practice Providers (APPs -  Physician Assistants and Nurse Practitioners) who all work together to provide you with the care you need, when you need it.  You will need a follow up appointment in 6 months  Providers on your designated Care Team:   Nicolasa Ducking, NP Eula Listen, PA-C Cadence Fransico Michael, [place]  COVID-19 Vaccine Information can be found at: [email or web address] For questions related to vaccine distribution or appointments, please email [email or web address] or call [phone].

## 2023-04-02 DIAGNOSIS — R55 Syncope and collapse: Secondary | ICD-10-CM

## 2023-04-13 ENCOUNTER — Ambulatory Visit: Payer: Medicare PPO | Attending: Cardiovascular Disease

## 2023-04-13 DIAGNOSIS — R55 Syncope and collapse: Secondary | ICD-10-CM

## 2023-04-13 LAB — ECHOCARDIOGRAM COMPLETE
Area-P 1/2: 2.99 cm2
S' Lateral: 2.4 cm

## 2023-04-14 ENCOUNTER — Ambulatory Visit: Payer: Self-pay | Admitting: Cardiovascular Disease

## 2023-06-01 ENCOUNTER — Ambulatory Visit: Payer: Medicare Other

## 2023-06-04 ENCOUNTER — Ambulatory Visit: Payer: Medicare Other

## 2023-06-09 ENCOUNTER — Ambulatory Visit (INDEPENDENT_AMBULATORY_CARE_PROVIDER_SITE_OTHER): Payer: Medicare PPO

## 2023-06-09 VITALS — BP 142/80 | Ht 67.0 in | Wt 142.0 lb

## 2023-06-09 DIAGNOSIS — Z780 Asymptomatic menopausal state: Secondary | ICD-10-CM

## 2023-06-09 DIAGNOSIS — Z0000 Encounter for general adult medical examination without abnormal findings: Secondary | ICD-10-CM

## 2023-06-09 NOTE — Patient Instructions (Addendum)
[name] , Thank you for taking time to come for your Medicare Wellness Visit. I appreciate your ongoing commitment to your health goals. Please review the following plan we discussed and let me know if I can assist you in the future.   Referrals/Orders/Follow-Ups/Clinician Recommendations: BDS referral sent  This is a list of the screening recommended for you and due dates:  Health Maintenance  Topic Date Due   Zoster (Shingles) Vaccine (1 of 2) Never done   DTaP/Tdap/Td vaccine (2 - Tdap) 09/05/2018   Flu Shot  05/07/2023   COVID-19 Vaccine (5 - 2023-24 season) 06/07/2023   Mammogram  07/10/2023   Medicare Annual Wellness Visit  06/08/2024   Pneumonia Vaccine  Completed   DEXA scan (bone density measurement)  Completed   Hepatitis C Screening  Completed   HPV Vaccine  Aged Out   Pap Smear  Discontinued   Colon Cancer Screening  Discontinued    Advanced directives: (ACP Link)Information on Advanced Care Planning can be found at [facility] Advance Health Care Directives Advance Health Care Directives ([email or web address]   Next Medicare Annual Wellness Visit scheduled for next year: Yes   06/14/24 @ 9:00 am in person

## 2023-06-09 NOTE — Progress Notes (Signed)
Subjective:   Shelia Taylor is a 77 y.o. female who presents for Medicare Annual (Subsequent) preventive examination.  Visit Complete: In person   Review of Systems     Cardiac Risk Factors include: advanced age (>34men, >67 women);dyslipidemia;hypertension     Objective:    Today's Vitals   06/09/23 0802  BP: (!) 142/80  Weight: 142 lb (64.4 kg)  Height: 5\' 7"  (1.702 m)   Body mass index is 22.24 kg/m.     06/09/2023    8:11 AM 05/28/2022    9:21 AM 05/27/2021    9:33 AM 05/23/2020    9:27 AM 07/11/2019   12:11 PM 07/11/2019   10:00 AM 03/22/2018    8:36 AM  Advanced Directives  Does Patient Have a Medical Advance Directive? No No No No No No No  Does patient want to make changes to medical advance directive?      No - Patient declined   Would patient like information on creating a medical advance directive? No - Patient declined No - Patient declined No - Patient declined No - Patient declined   No - Patient declined    Current Medications (verified) Outpatient Encounter Medications as of 06/09/2023  Medication Sig   aspirin 81 MG tablet Take 81 mg by mouth every other day.    Calcium Carbonate-Vitamin D (CALCIUM + D PO) Take by mouth 2 (two) times daily.   metoprolol succinate (TOPROL-XL) 25 MG 24 hr tablet Take 25 mg alternating with 12.5 mg (1/2 tablet) every other day. If pressures run less than 110, decrease to 12.5 mg daily   metroNIDAZOLE (METROCREAM) 0.75 % cream Apply topically 2 (two) times daily.   Multiple Vitamin (MULTIVITAMIN) tablet Take 1 tablet by mouth daily.   rosuvastatin (CRESTOR) 20 MG tablet TAKE 1 TABLET BY MOUTH DAILY   No facility-administered encounter medications on file as of 06/09/2023.    Allergies (verified) Propofol   History: Past Medical History:  Diagnosis Date   Allergy 07/19/19   DUB (dysfunctional uterine bleeding)    DUB (dysfunctional uterine bleeding)    Endometrial polyp    Hypertension    Mixed hyperlipidemia     Motion sickness    back seat cars   Osteoporosis    Past Surgical History:  Procedure Laterality Date   APPENDECTOMY     BREAST EXCISIONAL BIOPSY Left 1970's   NEG   CATARACT EXTRACTION, BILATERAL  2017   DILATION AND CURETTAGE OF UTERUS     TUBAL LIGATION     Family History  Problem Relation Age of Onset   Diabetes Mother    Hypertension Father    Cancer Father        colon cancer   Diabetes Sister    Breast cancer Cousin        Paternal 1st cousin   Social History   Socioeconomic History   Marital status: Widowed    Spouse name: Not on file   Number of children: 0   Years of education: Not on file   Highest education level: Not on file  Occupational History   Not on file  Tobacco Use   Smoking status: Never   Smokeless tobacco: Never  Vaping Use   Vaping status: Never Used  Substance and Sexual Activity   Alcohol use: Yes    Alcohol/week: 14.0 standard drinks of alcohol    Types: 14 Glasses of wine per week    Comment: 2 glasses per day   Drug use:  Never   Sexual activity: Not Currently    Birth control/protection: Surgical  Other Topics Concern   Not on file  Social History Narrative   Pt lives alone   Social Determinants of Health   Financial Resource Strain: Low Risk  (06/09/2023)   Overall Financial Resource Strain (CARDIA)    Difficulty of Paying Living Expenses: Not hard at all  Food Insecurity: No Food Insecurity (06/09/2023)   Hunger Vital Sign    Worried About Running Out of Food in the Last Year: Never true    Ran Out of Food in the Last Year: Never true  Transportation Needs: No Transportation Needs (06/09/2023)   PRAPARE - Administrator, Civil Service (Medical): No    Lack of Transportation (Non-Medical): No  Physical Activity: Sufficiently Active (06/09/2023)   Exercise Vital Sign    Days of Exercise per Week: 5 days    Minutes of Exercise per Session: 60 min  Stress: No Stress Concern Present (06/09/2023)   [employer] of  Occupational Health - Occupational Stress Questionnaire    Feeling of Stress : Not at all  Social Connections: Socially Isolated (06/09/2023)   Social Connection and Isolation Panel [NHANES]    Frequency of Communication with Friends and Family: Once a week    Frequency of Social Gatherings with Friends and Family: Twice a week    Attends Religious Services: Never    Database administrator or Organizations: No    Attends Banker Meetings: Never    Marital Status: Widowed    Tobacco Counseling Counseling given: Not Answered   Clinical Intake:  Pre-visit preparation completed: Yes  Pain : No/denies pain     Nutritional Risks: None Diabetes: No  How often do you need to have someone help you when you read instructions, pamphlets, or other written materials from your doctor or pharmacy?: 1 - Never  Interpreter Needed?: No  Information entered by :: Kennedy Bucker, LPN   Activities of Daily Living    06/09/2023    8:12 AM 06/07/2023    6:56 AM  In your present state of health, do you have any difficulty performing the following activities:  Hearing? 0 0  Vision? 0 0  Difficulty concentrating or making decisions? 0 0  Walking or climbing stairs? 0 0  Dressing or bathing? 0 0  Doing errands, shopping? 0 0  Preparing Food and eating ? N N  Using the Toilet? N N  In the past six months, have you accidently leaked urine? N N  Do you have problems with loss of bowel control? N N  Managing your Medications? N N  Managing your Finances? N N  Housekeeping or managing your Housekeeping? N N    Patient Care Team: Reubin Milan, MD as PCP - General (Internal Medicine) Nada Libman, MD as Consulting Physician (Vascular Surgery) Debbrah Alar, MD (Dermatology) Antonieta Iba, MD as Consulting Physician (Cardiology)  Indicate any recent Medical Services you may have received from other than [facility] providers in the past year (date may be approximate).      Assessment:   This is a routine wellness examination for Shelia Taylor.  Hearing/Vision screen Hearing Screening - Comments:: No aids Vision Screening - Comments:: Wears glasses for distance- Dr.King   Dietary issues and exercise activities discussed:     Goals Addressed             This Visit's Progress    DIET - EAT MORE  FRUITS AND VEGETABLES         Depression Screen    06/09/2023    8:09 AM 12/10/2022    8:22 AM 06/11/2022    8:44 AM 05/28/2022    9:29 AM 12/05/2021   10:29 AM 06/07/2021    8:04 AM 05/27/2021    9:32 AM  PHQ 2/9 Scores  PHQ - 2 Score 0 0 0 0 0 0 0  PHQ- 9 Score 0 1 2 1  0 1     Fall Risk    06/09/2023    8:12 AM 06/07/2023    6:56 AM 12/10/2022    8:22 AM 06/11/2022    8:44 AM 05/28/2022    9:21 AM  Fall Risk   Falls in the past year? 0 0 0 0 0  Number falls in past yr: 0 0 0 0 0  Injury with Fall? 0 0 0 0 0  Risk for fall due to : No Fall Risks  No Fall Risks No Fall Risks No Fall Risks  Follow up Falls prevention discussed;Falls evaluation completed  Falls evaluation completed Falls evaluation completed Falls evaluation completed    MEDICARE RISK AT HOME: Medicare Risk at Home Any stairs in or around the home?: Yes If so, are there any without handrails?: No Home free of loose throw rugs in walkways, pet beds, electrical cords, etc?: Yes Adequate lighting in your home to reduce risk of falls?: Yes Life alert?: No Use of a cane, walker or w/c?: No Grab bars in the bathroom?: Yes Shower chair or bench in shower?: Yes Elevated toilet seat or a handicapped toilet?: Yes  TIMED UP AND GO:  Was the test performed?  Yes  Length of time to ambulate 10 feet: 4 sec Gait steady and fast without use of assistive device    Cognitive Function:        06/09/2023    8:13 AM 05/28/2022    9:22 AM 05/23/2020    9:29 AM 05/20/2019    2:37 PM  6CIT Screen  What Year? 0 points 0 points 0 points 0 points  What month? 0 points 0 points 0 points 0 points  What time? 0  points 0 points 0 points 0 points  Count back from 20 0 points 0 points 0 points 0 points  Months in reverse 0 points 0 points 0 points 0 points  Repeat phrase 0 points 0 points 0 points 0 points  Total Score 0 points 0 points 0 points 0 points    Immunizations Immunization History  Administered Date(s) Administered   Fluad Quad(high Dose 65+) 08/26/2021   Influenza, High Dose Seasonal PF 08/13/2018   Influenza-Unspecified 07/28/2019, 07/06/2020   PFIZER Comirnaty(Gray Top)Covid-19 Tri-Sucrose Vaccine 03/05/2021   PFIZER(Purple Top)SARS-COV-2 Vaccination 02/03/2020, 03/06/2020, 10/02/2020   Pneumococcal Conjugate-13 01/31/2015   Pneumococcal Polysaccharide-23 08/07/1997, 11/15/2012   Td 09/05/2008    TDAP status: Due, Education has been provided regarding the importance of this vaccine. Advised may receive this vaccine at local pharmacy or Health Dept. Aware to provide a copy of the vaccination record if obtained from local pharmacy or Health Dept. Verbalized acceptance and understanding.  Flu Vaccine status: Declined, Education has been provided regarding the importance of this vaccine but patient still declined. Advised may receive this vaccine at local pharmacy or Health Dept. Aware to provide a copy of the vaccination record if obtained from local pharmacy or Health Dept. Verbalized acceptance and understanding.  Pneumococcal vaccine status: Up to date  Covid-19 vaccine  status: Completed vaccines  Qualifies for Shingles Vaccine? Yes   Zostavax completed No   Shingrix Completed?: No.    Education has been provided regarding the importance of this vaccine. Patient has been advised to call insurance company to determine out of pocket expense if they have not yet received this vaccine. Advised may also receive vaccine at local pharmacy or Health Dept. Verbalized acceptance and understanding.  Screening Tests Health Maintenance  Topic Date Due   Zoster Vaccines- Shingrix (1 of 2)  Never done   DTaP/Tdap/Td (2 - Tdap) 09/05/2018   INFLUENZA VACCINE  05/07/2023   COVID-19 Vaccine (5 - 2023-24 season) 06/07/2023   MAMMOGRAM  07/10/2023   Medicare Annual Wellness (AWV)  06/08/2024   Pneumonia Vaccine 10+ Years old  Completed   DEXA SCAN  Completed   Hepatitis C Screening  Completed   HPV VACCINES  Aged Out   PAP SMEAR-Modifier  Discontinued   Colonoscopy  Discontinued    Health Maintenance  Health Maintenance Due  Topic Date Due   Zoster Vaccines- Shingrix (1 of 2) Never done   DTaP/Tdap/Td (2 - Tdap) 09/05/2018   INFLUENZA VACCINE  05/07/2023   COVID-19 Vaccine (5 - 2023-24 season) 06/07/2023    Colorectal cancer screening: No longer required.   Mammogram status: No longer required due to age.  Bone Density status: Completed 06/20/20. Results reflect: Bone density results: OSTEOPOROSIS. Repeat every 2 years.  Lung Cancer Screening: (Low Dose CT Chest recommended if Age 5-80 years, 20 pack-year currently smoking OR have quit w/in 15years.) does not qualify.    Additional Screening:  Hepatitis C Screening: does qualify; Completed 05/31/20  Vision Screening: Recommended annual ophthalmology exams for early detection of glaucoma and other disorders of the eye. Is the patient up to date with their annual eye exam?  Yes  Who is the provider or what is the name of the office in which the patient attends annual eye exams? Dr.King If pt is not established with a provider, would they like to be referred to a provider to establish care? No .   Dental Screening: Recommended annual dental exams for proper oral hygiene    Community Resource Referral / Chronic Care Management: CRR required this visit?  No   CCM required this visit?  No     Plan:     I have personally reviewed and noted the following in the patient's chart:   Medical and social history Use of alcohol, tobacco or illicit drugs  Current medications and supplements including opioid  prescriptions. Patient is not currently taking opioid prescriptions. Functional ability and status Nutritional status Physical activity Advanced directives List of other physicians Hospitalizations, surgeries, and ER visits in previous 12 months Vitals Screenings to include cognitive, depression, and falls Referrals and appointments  In addition, I have reviewed and discussed with patient certain preventive protocols, quality metrics, and best practice recommendations. A written personalized care plan for preventive services as well as general preventive health recommendations were provided to patient.     Hal Hope, LPN   10/11/1094   After Visit Summary: my chart  Nurse Notes: none

## 2023-06-15 ENCOUNTER — Encounter: Payer: Self-pay | Admitting: Internal Medicine

## 2023-06-15 ENCOUNTER — Ambulatory Visit (INDEPENDENT_AMBULATORY_CARE_PROVIDER_SITE_OTHER): Payer: Medicare PPO | Admitting: Internal Medicine

## 2023-06-15 VITALS — BP 126/72 | HR 73 | Ht 67.0 in | Wt 144.2 lb

## 2023-06-15 DIAGNOSIS — I479 Paroxysmal tachycardia, unspecified: Secondary | ICD-10-CM | POA: Diagnosis not present

## 2023-06-15 DIAGNOSIS — I10 Essential (primary) hypertension: Secondary | ICD-10-CM | POA: Diagnosis not present

## 2023-06-15 DIAGNOSIS — Z0000 Encounter for general adult medical examination without abnormal findings: Secondary | ICD-10-CM

## 2023-06-15 DIAGNOSIS — M810 Age-related osteoporosis without current pathological fracture: Secondary | ICD-10-CM

## 2023-06-15 DIAGNOSIS — Z1231 Encounter for screening mammogram for malignant neoplasm of breast: Secondary | ICD-10-CM

## 2023-06-15 DIAGNOSIS — E782 Mixed hyperlipidemia: Secondary | ICD-10-CM | POA: Diagnosis not present

## 2023-06-15 LAB — POCT URINALYSIS DIPSTICK
Bilirubin, UA: NEGATIVE
Blood, UA: NEGATIVE
Glucose, UA: NEGATIVE
Ketones, UA: POSITIVE
Leukocytes, UA: NEGATIVE
Nitrite, UA: NEGATIVE
Protein, UA: POSITIVE — AB
Spec Grav, UA: >=1.03 — AB (ref 1.010–1.025)
Urobilinogen, UA: 0.2 U/dL
pH, UA: 5 (ref 5.0–8.0)

## 2023-06-15 NOTE — Patient Instructions (Signed)
Call [facility] to schedule your mammogram and Bone Density at [phone].  

## 2023-06-15 NOTE — Assessment & Plan Note (Signed)
Previously on Fosamax. Last DEXA 2021 - osteopenia Continues on Calcium and vitamin D

## 2023-06-15 NOTE — Progress Notes (Signed)
Date:  06/15/2023   Name:  Shelia Taylor   DOB:  1946/09/01   MRN:  [id number]   Chief Complaint: Annual Exam Shelia Taylor is a 77 y.o. female who presents today for her Complete Annual Exam. She feels well. She reports exercising. She reports she is sleeping well. Breast complaints - none.  Mammogram: 07/2022 DEXA: 06/2020 - repeat ordered Colonoscopy: 2021 - discontinued due to ? Seizure with anesthesia  Health Maintenance Due  Topic Date Due   Zoster Vaccines- Shingrix (1 of 2) Never done   DTaP/Tdap/Td (2 - Tdap) 09/05/2018   COVID-19 Vaccine (5 - 2023-24 season) 06/07/2023    Immunization History  Administered Date(s) Administered   Fluad Quad(high Dose 65+) 08/26/2021   Influenza, High Dose Seasonal PF 08/13/2018   Influenza-Unspecified 07/28/2019, 07/06/2020   PFIZER Comirnaty(Gray Top)Covid-19 Tri-Sucrose Vaccine 03/05/2021   PFIZER(Purple Top)SARS-COV-2 Vaccination 02/03/2020, 03/06/2020, 10/02/2020   Pneumococcal Conjugate-13 01/31/2015   Pneumococcal Polysaccharide-23 08/07/1997, 11/15/2012   Td 09/05/2008    Hypertension This is a chronic problem. The problem has been waxing and waning since onset. Pertinent negatives include no chest pain, headaches, palpitations or shortness of breath. Past treatments include beta blockers.  Hyperlipidemia This is a chronic problem. The problem is controlled. Pertinent negatives include no chest pain or shortness of breath. Current antihyperlipidemic treatment includes statins.    Lab Results  Component Value Date   NA 142 12/10/2022   K 4.3 12/10/2022   CO2 20 12/10/2022   GLUCOSE 100 (H) 12/10/2022   BUN 21 12/10/2022   CREATININE 1.03 (H) 12/10/2022   CALCIUM 10.1 12/10/2022   EGFR 57 (L) 12/10/2022   GFRNONAA 52 (L) 11/15/2020   Lab Results  Component Value Date   CHOL 184 06/11/2022   HDL 118 06/11/2022   LDLCALC 54 06/11/2022   TRIG 60 06/11/2022   CHOLHDL 1.6 06/11/2022   Lab Results   Component Value Date   TSH 0.936 06/11/2022   No results found for: "HGBA1C" Lab Results  Component Value Date   WBC 7.2 06/11/2022   HGB 14.3 06/11/2022   HCT 42.1 06/11/2022   MCV 97 06/11/2022   PLT 285 06/11/2022   Lab Results  Component Value Date   ALT 40 (H) 12/10/2022   AST 39 12/10/2022   ALKPHOS 70 12/10/2022   BILITOT 0.5 12/10/2022   Lab Results  Component Value Date   VD25OH 34.9 06/07/2021     Review of Systems  Constitutional:  Negative for chills, fatigue and fever.  HENT:  Negative for congestion, hearing loss, tinnitus, trouble swallowing and voice change.   Eyes:  Negative for visual disturbance.  Respiratory:  Negative for cough, chest tightness, shortness of breath and wheezing.   Cardiovascular:  Negative for chest pain, palpitations and leg swelling.  Gastrointestinal:  Negative for abdominal pain, constipation, diarrhea and vomiting.  Endocrine: Negative for polydipsia and polyuria.  Genitourinary:  Negative for dysuria, frequency, genital sores, vaginal bleeding and vaginal discharge.  Musculoskeletal:  Negative for arthralgias, gait problem and joint swelling.  Skin:  Negative for color change and rash.  Neurological:  Negative for dizziness, tremors, light-headedness and headaches.  Hematological:  Negative for adenopathy. Does not bruise/bleed easily.  Psychiatric/Behavioral:  Negative for dysphoric mood and sleep disturbance. The patient is not nervous/anxious.     Patient Active Problem List   Diagnosis Date Noted   Paroxysmal tachycardia (HCC) 06/11/2022   PAD (peripheral artery disease) (HCC) 12/05/2021   Essential hypertension 05/20/2019  Mixed hyperlipidemia 05/20/2019   Bilateral carotid artery stenosis 05/20/2019   Family history of colon cancer 05/20/2019   Endometrial polyp    Osteoporosis of multiple sites     Allergies  Allergen Reactions   Propofol Other (See Comments)    Altered Mental Status after propofol given  prior to colonoscopy on 07/11/2019, per MD documentation "likely not seizure".     Past Surgical History:  Procedure Laterality Date   APPENDECTOMY     BREAST EXCISIONAL BIOPSY Left 1970's   NEG   CATARACT EXTRACTION, BILATERAL  2017   DILATION AND CURETTAGE OF UTERUS     TUBAL LIGATION      Social History   Tobacco Use   Smoking status: Never   Smokeless tobacco: Never  Vaping Use   Vaping status: Never Used  Substance Use Topics   Alcohol use: Yes    Alcohol/week: 3.0 standard drinks of alcohol    Types: 3 Glasses of wine per week    Comment: 2 glasses per day   Drug use: Never     Medication list has been reviewed and updated.  Current Meds  Medication Sig   aspirin 81 MG tablet Take 81 mg by mouth every other day.    Calcium Carbonate-Vitamin D (CALCIUM + D PO) Take by mouth 2 (two) times daily.   metoprolol succinate (TOPROL-XL) 25 MG 24 hr tablet Take 25 mg alternating with 12.5 mg (1/2 tablet) every other day. If pressures run less than 110, decrease to 12.5 mg daily   metroNIDAZOLE (METROCREAM) 0.75 % cream Apply topically 2 (two) times daily.   Multiple Vitamin (MULTIVITAMIN) tablet Take 1 tablet by mouth daily.   rosuvastatin (CRESTOR) 20 MG tablet TAKE 1 TABLET BY MOUTH DAILY       06/15/2023    8:40 AM 12/10/2022    8:22 AM 06/11/2022    8:44 AM 05/28/2022    9:29 AM  GAD 7 : Generalized Anxiety Score  Nervous, Anxious, on Edge 0 0 0 0  Control/stop worrying 0 0 0 0  Worry too much - different things 0 0 0 0  Trouble relaxing 0 0 0 0  Restless 0 0 0 0  Easily annoyed or irritable 0 0 0 0  Afraid - awful might happen 0 0 0 0  Total GAD 7 Score 0 0 0 0  Anxiety Difficulty Not difficult at all Not difficult at all Not difficult at all Not difficult at all       06/15/2023    8:40 AM 06/09/2023    8:09 AM 12/10/2022    8:22 AM  Depression screen PHQ 2/9  Decreased Interest 0 0   Down, Depressed, Hopeless 0 0 0  PHQ - 2 Score 0 0 0  Altered sleeping 0 0 1   Tired, decreased energy 0 0 0  Change in appetite 0 0 0  Feeling bad or failure about yourself  0 0 0  Trouble concentrating 0 0 0  Moving slowly or fidgety/restless 0 0 0  Suicidal thoughts 0 0 0  PHQ-9 Score 0 0 1  Difficult doing work/chores Not difficult at all Not difficult at all Not difficult at all    BP Readings from Last 3 Encounters:  06/15/23 126/72  06/09/23 (!) 142/80  03/30/23 (!) 152/82    Physical Exam Vitals and nursing note reviewed.  Constitutional:      General: She is not in acute distress.    Appearance: She is well-developed.  HENT:     Head: Normocephalic and atraumatic.     Right Ear: Tympanic membrane and ear canal normal.     Left Ear: Tympanic membrane and ear canal normal.     Nose:     Right Sinus: No maxillary sinus tenderness.     Left Sinus: No maxillary sinus tenderness.  Eyes:     General: No scleral icterus.       Right eye: No discharge.        Left eye: No discharge.     Conjunctiva/sclera: Conjunctivae normal.  Neck:     Thyroid: No thyromegaly.     Vascular: No carotid bruit.  Cardiovascular:     Rate and Rhythm: Normal rate and regular rhythm.     Pulses: Normal pulses.     Heart sounds: Normal heart sounds.  Pulmonary:     Effort: Pulmonary effort is normal. No respiratory distress.     Breath sounds: No wheezing.  Chest:  Breasts:    Right: No mass, nipple discharge, skin change or tenderness.     Left: No mass, nipple discharge, skin change or tenderness.  Abdominal:     General: Bowel sounds are normal.     Palpations: Abdomen is soft.     Tenderness: There is no abdominal tenderness.  Musculoskeletal:     Cervical back: Normal range of motion. No erythema.     Right lower leg: No edema.     Left lower leg: No edema.  Lymphadenopathy:     Cervical: No cervical adenopathy.  Skin:    General: Skin is warm and dry.     Findings: No rash.  Neurological:     Mental Status: She is alert and oriented to person,  place, and time.     Cranial Nerves: No cranial nerve deficit.     Sensory: No sensory deficit.     Deep Tendon Reflexes: Reflexes are normal and symmetric.  Psychiatric:        Attention and Perception: Attention normal.        Mood and Affect: Mood normal.     Wt Readings from Last 3 Encounters:  06/15/23 144 lb 3.2 oz (65.4 kg)  06/09/23 142 lb (64.4 kg)  03/30/23 145 lb 2 oz (65.8 kg)    BP 126/72   Pulse 73   Ht 5\' 7"  (1.702 m)   Wt 144 lb 3.2 oz (65.4 kg)   SpO2 99%   BMI 22.58 kg/m   Assessment and Plan:  Problem List Items Addressed This Visit       Unprioritized   Paroxysmal tachycardia (HCC)    Recent cardiac monitor showed no malignant rhythms BP low on metoprolol 37.5 daily so dose reduced      Osteoporosis of multiple sites (Chronic)    Previously on Fosamax. Last DEXA 2021 - osteopenia Continues on Calcium and vitamin D       Mixed hyperlipidemia (Chronic)    LDL is  Lab Results  Component Value Date   LDLCALC 54 06/11/2022   Currently being treated with Crestor with good compliance and no concerns.       Relevant Orders   Lipid panel   Essential hypertension (Chronic)    Normal exam with stable BP on metoprolol - 25 mg alt with 12.5 mg BP at home in the normal range No concerns or side effects to current medication. No change in regimen; continue low sodium diet.       Relevant Orders  CBC with Differential/Platelet   TSH   POCT urinalysis dipstick   Comprehensive metabolic panel   Other Visit Diagnoses     Annual physical exam    -  Primary   continue healthy diet and exercise return for fall Flu vaccine consider Shingrix at the pharmacy   Encounter for screening mammogram for breast cancer       Relevant Orders   MM 3D SCREENING MAMMOGRAM BILATERAL BREAST       Return in about 1 year (around 06/14/2024) for CPX, HTN.    Reubin Milan, MD [facility] [place]

## 2023-06-15 NOTE — Assessment & Plan Note (Signed)
LDL is  Lab Results  Component Value Date   LDLCALC 54 06/11/2022   Currently being treated with Crestor with good compliance and no concerns.

## 2023-06-15 NOTE — Assessment & Plan Note (Signed)
Recent cardiac monitor showed no malignant rhythms BP low on metoprolol 37.5 daily so dose reduced

## 2023-06-15 NOTE — Assessment & Plan Note (Addendum)
Normal exam with stable BP on metoprolol - 25 mg alt with 12.5 mg BP at home in the normal range No concerns or side effects to current medication. No change in regimen; continue low sodium diet.

## 2023-06-16 LAB — CBC WITH DIFFERENTIAL/PLATELET
Basophils Absolute: 0 10*3/uL (ref 0.0–0.2)
Basos: 0 %
EOS (ABSOLUTE): 0.1 10*3/uL (ref 0.0–0.4)
Eos: 2 %
Hematocrit: 40.9 % (ref 34.0–46.6)
Hemoglobin: 13.7 g/dL (ref 11.1–15.9)
Immature Grans (Abs): 0 10*3/uL (ref 0.0–0.1)
Immature Granulocytes: 0 %
Lymphocytes Absolute: 1 10*3/uL (ref 0.7–3.1)
Lymphs: 18 %
MCH: 32.6 pg (ref 26.6–33.0)
MCHC: 33.5 g/dL (ref 31.5–35.7)
MCV: 97 fL (ref 79–97)
Monocytes Absolute: 0.6 10*3/uL (ref 0.1–0.9)
Monocytes: 11 %
Neutrophils Absolute: 4 10*3/uL (ref 1.4–7.0)
Neutrophils: 69 %
Platelets: 226 10*3/uL (ref 150–450)
RBC: 4.2 x10E6/uL (ref 3.77–5.28)
RDW: 13.4 % (ref 11.7–15.4)
WBC: 5.8 10*3/uL (ref 3.4–10.8)

## 2023-06-16 LAB — COMPREHENSIVE METABOLIC PANEL
ALT: 39 IU/L — ABNORMAL HIGH (ref 0–32)
AST: 45 IU/L — ABNORMAL HIGH (ref 0–40)
Albumin: 4.6 g/dL (ref 3.8–4.8)
Alkaline Phosphatase: 66 IU/L (ref 44–121)
BUN/Creatinine Ratio: 14 (ref 12–28)
BUN: 17 mg/dL (ref 8–27)
Bilirubin Total: 0.5 mg/dL (ref 0.0–1.2)
CO2: 20 mmol/L (ref 20–29)
Calcium: 9.7 mg/dL (ref 8.7–10.3)
Chloride: 103 mmol/L (ref 96–106)
Creatinine, Ser: 1.19 mg/dL — ABNORMAL HIGH (ref 0.57–1.00)
Globulin, Total: 2.8 g/dL (ref 1.5–4.5)
Glucose: 104 mg/dL — ABNORMAL HIGH (ref 70–99)
Potassium: 4.9 mmol/L (ref 3.5–5.2)
Sodium: 141 mmol/L (ref 134–144)
Total Protein: 7.4 g/dL (ref 6.0–8.5)
eGFR: 48 mL/min/{1.73_m2} — ABNORMAL LOW (ref >59)

## 2023-06-16 LAB — LIPID PANEL
Chol/HDL Ratio: 1.5 ratio (ref 0.0–4.4)
Cholesterol, Total: 172 mg/dL (ref 100–199)
HDL: 114 mg/dL (ref >39)
LDL Chol Calc (NIH): 47 mg/dL (ref 0–99)
Triglycerides: 54 mg/dL (ref 0–149)
VLDL Cholesterol Cal: 11 mg/dL (ref 5–40)

## 2023-06-16 LAB — TSH: TSH: 1.67 u[IU]/mL (ref 0.450–4.500)

## 2023-06-16 NOTE — Progress Notes (Signed)
LVM to call back, CRM created PEC may give results

## 2023-07-27 ENCOUNTER — Other Ambulatory Visit (INDEPENDENT_AMBULATORY_CARE_PROVIDER_SITE_OTHER): Payer: Self-pay | Admitting: Nurse Practitioner

## 2023-07-27 DIAGNOSIS — I6523 Occlusion and stenosis of bilateral carotid arteries: Secondary | ICD-10-CM

## 2023-07-28 ENCOUNTER — Ambulatory Visit (INDEPENDENT_AMBULATORY_CARE_PROVIDER_SITE_OTHER): Payer: Medicare PPO

## 2023-07-28 ENCOUNTER — Ambulatory Visit (INDEPENDENT_AMBULATORY_CARE_PROVIDER_SITE_OTHER): Payer: Medicare PPO | Admitting: Vascular Surgery

## 2023-07-28 ENCOUNTER — Encounter (INDEPENDENT_AMBULATORY_CARE_PROVIDER_SITE_OTHER): Payer: Self-pay | Admitting: Vascular Surgery

## 2023-07-28 VITALS — BP 149/72 | HR 80 | Resp 16 | Wt 140.6 lb

## 2023-07-28 DIAGNOSIS — I6523 Occlusion and stenosis of bilateral carotid arteries: Secondary | ICD-10-CM

## 2023-07-28 DIAGNOSIS — E782 Mixed hyperlipidemia: Secondary | ICD-10-CM | POA: Diagnosis not present

## 2023-07-28 DIAGNOSIS — I10 Essential (primary) hypertension: Secondary | ICD-10-CM | POA: Diagnosis not present

## 2023-07-28 NOTE — Assessment & Plan Note (Signed)
blood pressure control important in reducing the progression of atherosclerotic disease. On appropriate oral medications.  

## 2023-07-28 NOTE — Progress Notes (Signed)
MRN : [id number]  Shelia Taylor is a 77 y.o. (1946/05/26) female who presents with chief complaint of  Chief Complaint  Patient presents with   Follow-up    1yr carotid ultrasound  .    History of Present Illness: Patient returns today in follow up of her carotid disease.  She is doing well today.  She denies any new complaints or problems since she was seen last year.  No focal neurologic symptoms. Specifically, the patient denies amaurosis fugax, speech or swallowing difficulties, or arm or leg weakness or numbness Carotid duplex today shows 1 to 39% right ICA stenosis and 40 to 59% left ICA stenosis.  Current Outpatient Medications  Medication Sig Dispense Refill   aspirin 81 MG tablet Take 81 mg by mouth every other day.      Calcium Carbonate-Vitamin D (CALCIUM + D PO) Take by mouth 2 (two) times daily.     metoprolol succinate (TOPROL-XL) 25 MG 24 hr tablet Take 25 mg alternating with 12.5 mg (1/2 tablet) every other day. If pressures run less than 110, decrease to 12.5 mg daily 25 tablet 6   metroNIDAZOLE (METROCREAM) 0.75 % cream Apply topically 2 (two) times daily.     Multiple Vitamin (MULTIVITAMIN) tablet Take 1 tablet by mouth daily.     rosuvastatin (CRESTOR) 20 MG tablet TAKE 1 TABLET BY MOUTH DAILY 90 tablet 3   No current facility-administered medications for this visit.    Past Medical History:  Diagnosis Date   Allergy 07/19/19   DUB (dysfunctional uterine bleeding)    DUB (dysfunctional uterine bleeding)    Endometrial polyp    Hypertension    Mixed hyperlipidemia    Motion sickness    back seat cars   Osteoporosis     Past Surgical History:  Procedure Laterality Date   APPENDECTOMY     BREAST EXCISIONAL BIOPSY Left 1970's   NEG   CATARACT EXTRACTION, BILATERAL  2017   DILATION AND CURETTAGE OF UTERUS     TUBAL LIGATION       Social History   Tobacco Use   Smoking status: Never   Smokeless tobacco: Never  Vaping Use   Vaping  status: Never Used  Substance Use Topics   Alcohol use: Yes    Alcohol/week: 3.0 standard drinks of alcohol    Types: 3 Glasses of wine per week    Comment: 2 glasses per day   Drug use: Never       Family History  Problem Relation Age of Onset   Diabetes Mother    Hypertension Father    Cancer Father        colon cancer   Diabetes Sister    Breast cancer Cousin        Paternal 1st cousin     Allergies  Allergen Reactions   Propofol Other (See Comments)    Altered Mental Status after propofol given prior to colonoscopy on 07/11/2019, per MD documentation "likely not seizure".      REVIEW OF SYSTEMS (Negative unless checked)  Constitutional: [] Weight loss  [] Fever  [] Chills Cardiac: [] Chest pain   [] Chest pressure   [] Palpitations   [] Shortness of breath when laying flat   [] Shortness of breath at rest   [] Shortness of breath with exertion. Vascular:  [] Pain in legs with walking   [] Pain in legs at rest   [] Pain in legs when laying flat   [] Claudication   [] Pain in feet when walking  [] Pain in feet  at rest  [] Pain in feet when laying flat   [] History of DVT   [] Phlebitis   [] Swelling in legs   [] Varicose veins   [] Non-healing ulcers Pulmonary:   [] Uses home oxygen   [] Productive cough   [] Hemoptysis   [] Wheeze  [] COPD   [] Asthma Neurologic:  [] Dizziness  [] Blackouts   [] Seizures   [] History of stroke   [] History of TIA  [] Aphasia   [] Temporary blindness   [] Dysphagia   [] Weakness or numbness in arms   [] Weakness or numbness in legs Musculoskeletal:  [x] Arthritis   [] Joint swelling   [] Joint pain   [] Low back pain Hematologic:  [] Easy bruising  [] Easy bleeding   [] Hypercoagulable state   [] Anemic   Gastrointestinal:  [] Blood in stool   [] Vomiting blood  [] Gastroesophageal reflux/heartburn   [] Abdominal pain Genitourinary:  [] Chronic kidney disease   [] Difficult urination  [] Frequent urination  [] Burning with urination   [] Hematuria Skin:  [] Rashes   [] Ulcers    [] Wounds Psychological:  [] History of anxiety   []  History of major depression.  Physical Examination  BP (!) 149/72 (BP Location: Right Arm)   Pulse 80   Resp 16   Wt 140 lb 9.6 oz (63.8 kg)   BMI 22.02 kg/m  Gen:  WD/WN, NAD Head: [place]/AT, No temporalis wasting. Ear/Nose/Throat: Hearing grossly intact, nares w/o erythema or drainage Eyes: Conjunctiva clear. Sclera non-icteric Neck: Supple.  Trachea midline. Left carotid bruit Pulmonary:  Good air movement, no use of accessory muscles.  Cardiac: RRR, no JVD Vascular:  Vessel Right Left  Radial Palpable Palpable                          PT Palpable Palpable  DP Palpable Palpable   Gastrointestinal: soft, non-tender/non-distended. No guarding/reflex.  Musculoskeletal: M/S 5/5 throughout.  No deformity or atrophy. No edema. Neurologic: Sensation grossly intact in extremities.  Symmetrical.  Speech is fluent.  Psychiatric: Judgment intact, Mood & affect appropriate for pt's clinical situation. Dermatologic: No rashes or ulcers noted.  No cellulitis or open wounds.      Labs Recent Results (from the past 2160 hour(s))  POCT urinalysis dipstick     Status: Abnormal   Collection Time: 06/15/23  9:11 AM  Result Value Ref Range   Color, UA yellow    Clarity, UA clear    Glucose, UA Negative Negative   Bilirubin, UA neg    Ketones, UA positive    Spec Grav, UA >=1.030 (A) 1.010 - 1.025   Blood, UA neg    pH, UA 5.0 5.0 - 8.0   Protein, UA Positive (A) Negative   Urobilinogen, UA 0.2 0.2 or 1.0 E.U./dL   Nitrite, UA neg    Leukocytes, UA Negative Negative   Appearance clear    Odor none   CBC with Differential/Platelet     Status: None   Collection Time: 06/15/23  9:31 AM  Result Value Ref Range   WBC 5.8 3.4 - 10.8 x10E3/uL   RBC 4.20 3.77 - 5.28 x10E6/uL   Hemoglobin 13.7 11.1 - 15.9 g/dL   Hematocrit 96.2 95.2 - 46.6 %   MCV 97 79 - 97 fL   MCH 32.6 26.6 - 33.0 pg   MCHC 33.5 31.5 - 35.7 g/dL   RDW 84.1  32.4 - 40.1 %   Platelets 226 150 - 450 x10E3/uL   Neutrophils 69 Not Estab. %   Lymphs 18 Not Estab. %   Monocytes 11 Not  Estab. %   Eos 2 Not Estab. %   Basos 0 Not Estab. %   Neutrophils Absolute 4.0 1.4 - 7.0 x10E3/uL   Lymphocytes Absolute 1.0 0.7 - 3.1 x10E3/uL   Monocytes Absolute 0.6 0.1 - 0.9 x10E3/uL   EOS (ABSOLUTE) 0.1 0.0 - 0.4 x10E3/uL   Basophils Absolute 0.0 0.0 - 0.2 x10E3/uL   Immature Granulocytes 0 Not Estab. %   Immature Grans (Abs) 0.0 0.0 - 0.1 x10E3/uL  Lipid panel     Status: None   Collection Time: 06/15/23  9:31 AM  Result Value Ref Range   Cholesterol, Total 172 100 - 199 mg/dL   Triglycerides 54 0 - 149 mg/dL   HDL 161 >09 mg/dL   VLDL Cholesterol Cal 11 5 - 40 mg/dL   LDL Chol Calc (NIH) 47 0 - 99 mg/dL   Chol/HDL Ratio 1.5 0.0 - 4.4 ratio    Comment:                                   T. Chol/HDL Ratio                                             Men  Women                               1/2 Avg.Risk  3.4    3.3                                   Avg.Risk  5.0    4.4                                2X Avg.Risk  9.6    7.1                                3X Avg.Risk 23.4   11.0   TSH     Status: None   Collection Time: 06/15/23  9:31 AM  Result Value Ref Range   TSH 1.670 0.450 - 4.500 uIU/mL  Comprehensive metabolic panel     Status: Abnormal   Collection Time: 06/15/23  9:31 AM  Result Value Ref Range   Glucose 104 (H) 70 - 99 mg/dL   BUN 17 8 - 27 mg/dL   Creatinine, Ser 6.04 (H) 0.57 - 1.00 mg/dL   eGFR 48 (L) >54 UJ/WJX/9.14   BUN/Creatinine Ratio 14 12 - 28   Sodium 141 134 - 144 mmol/L   Potassium 4.9 3.5 - 5.2 mmol/L   Chloride 103 96 - 106 mmol/L   CO2 20 20 - 29 mmol/L   Calcium 9.7 8.7 - 10.3 mg/dL   Total Protein 7.4 6.0 - 8.5 g/dL   Albumin 4.6 3.8 - 4.8 g/dL   Globulin, Total 2.8 1.5 - 4.5 g/dL   Bilirubin Total 0.5 0.0 - 1.2 mg/dL   Alkaline Phosphatase 66 44 - 121 IU/L   AST 45 (H) 0 - 40 IU/L   ALT 39 (H) 0 - 32 IU/L     Radiology No  results found.  Assessment/Plan  Bilateral carotid artery stenosis Carotid duplex today shows 1 to 39% right ICA stenosis and 40 to 59% left ICA stenosis.  Continue aspirin and statin agent.  Follow-up annually with duplex.  Essential hypertension blood pressure control important in reducing the progression of atherosclerotic disease. On appropriate oral medications.   Mixed hyperlipidemia lipid control important in reducing the progression of atherosclerotic disease. Continue statin therapy    Festus Barren, MD  07/28/2023 11:02 AM    This note was created with Dragon medical transcription system.  Any errors from dictation are purely unintentional

## 2023-07-28 NOTE — Assessment & Plan Note (Signed)
Carotid duplex today shows 1 to 39% right ICA stenosis and 40 to 59% left ICA stenosis.  Continue aspirin and statin agent.  Follow-up annually with duplex.

## 2023-07-28 NOTE — Assessment & Plan Note (Signed)
lipid control important in reducing the progression of atherosclerotic disease. Continue statin therapy  

## 2023-08-25 DIAGNOSIS — Z0100 Encounter for examination of eyes and vision without abnormal findings: Secondary | ICD-10-CM | POA: Diagnosis not present

## 2023-08-25 DIAGNOSIS — Z961 Presence of intraocular lens: Secondary | ICD-10-CM | POA: Diagnosis not present

## 2023-08-25 DIAGNOSIS — H26491 Other secondary cataract, right eye: Secondary | ICD-10-CM | POA: Diagnosis not present

## 2023-08-26 ENCOUNTER — Ambulatory Visit
Admission: RE | Admit: 2023-08-26 | Discharge: 2023-08-26 | Disposition: A | Discharge status: Home / Self Care | Payer: Medicare PPO | Source: Ambulatory Visit | Attending: Internal Medicine | Admitting: Internal Medicine

## 2023-08-26 DIAGNOSIS — Z1231 Encounter for screening mammogram for malignant neoplasm of breast: Secondary | ICD-10-CM | POA: Diagnosis not present

## 2023-08-26 DIAGNOSIS — Z780 Asymptomatic menopausal state: Secondary | ICD-10-CM | POA: Insufficient documentation

## 2023-08-26 DIAGNOSIS — M810 Age-related osteoporosis without current pathological fracture: Secondary | ICD-10-CM | POA: Diagnosis not present

## 2023-09-07 ENCOUNTER — Other Ambulatory Visit: Payer: Self-pay | Admitting: Cardiovascular Disease

## 2023-09-08 DIAGNOSIS — H26491 Other secondary cataract, right eye: Secondary | ICD-10-CM | POA: Diagnosis not present

## 2023-09-28 NOTE — Progress Notes (Deleted)
Cardiology Office Note  Date:  09/28/2023   ID:  Shelia Taylor, DOB Sep 27, 1946, MRN [id number]  PCP:  Reubin Milan, MD   No chief complaint on file.   HPI:  Shelia Taylor is a 77 year old woman with a past medical history of PAD, followed by vascular in [facility] carotid stenosis 40 to 59% disease bilaterally,  Hypertension Hyperlipidemia Who presents for follow-up of her palpitations, hyperlipidemia, PAD  Last seen in clinic June 2024  Significant events from prior clinic visit, episode of syncope 3 weeks ago,  syncope on the driveway Reports that she was wheeling the garbage can back to the house, got back halfway when she acutely went down in the driveway Reports no presyncope symptoms preceding the event Was not dizzy, no tunnel vision, no GI issues, had no warning Passerby driving by helped her up Skinned left side of face Did not go to the ER, went back into the house to recover  Played golf that Am, feels that she was hydrated Event happened middle of the afternoon No other episodes  Reports that several weeks ago she started taking metoprolol succinate 37.5 mg every other day Low pressures on her measurements, lowest was 97 systolic, often 107 systolic Blood pressures have persisted even taking the medication every other day in fact it would appear days when she takes the medication night before pressure is lower the next morning less than 110 systolic  Orthostatics positive today pressure 160 down to 130 with standing heart rate 76 up to 84  On metoprolol in the past for tachycardia Otherwise reports that she is active Lives alone, continues to drive No chest pain concerning for angina  EKG personally reviewed by myself on todays visit Normal sinus rhythm rate 76 bpm left anterior fascicular block no significant ST-T wave changes  Other past medical history reviewed Prior event monitor reviewed from 2022 Images pulled up and discussed rare  short SVT Very rare episodes of tachycardia lasting several seconds No patient triggered events recorded  Carotid ultrasound reviewed Right Carotid: Velocities in the right ICA are consistent with a 40-59% stenosis.  Left Carotid: Velocities in the left ICA are consistent with a 40-59%  stenosis   PMH:   has a past medical history of Allergy (07/19/19), DUB (dysfunctional uterine bleeding), DUB (dysfunctional uterine bleeding), Endometrial polyp, Hypertension, Mixed hyperlipidemia, Motion sickness, and Osteoporosis.  PSH:    Past Surgical History:  Procedure Laterality Date   APPENDECTOMY     BREAST EXCISIONAL BIOPSY Left 1970's   NEG   CATARACT EXTRACTION, BILATERAL  2017   DILATION AND CURETTAGE OF UTERUS     TUBAL LIGATION      Current Outpatient Medications  Medication Sig Dispense Refill   aspirin 81 MG tablet Take 81 mg by mouth every other day.      Calcium Carbonate-Vitamin D (CALCIUM + D PO) Take by mouth 2 (two) times daily.     metoprolol succinate (TOPROL-XL) 25 MG 24 hr tablet TAKE 1 TABLET ALTERNATING WITH 1/2 TABLET EVERY OTHER DAY. IF PRESSURES RUN LESS THAN 110, DECREASE TO 1/2 TABLET DAILY 68 tablet 1   metroNIDAZOLE (METROCREAM) 0.75 % cream Apply topically 2 (two) times daily.     Multiple Vitamin (MULTIVITAMIN) tablet Take 1 tablet by mouth daily.     rosuvastatin (CRESTOR) 20 MG tablet TAKE 1 TABLET BY MOUTH DAILY 90 tablet 3   No current facility-administered medications for this visit.    Allergies:   Propofol  Social History:  The patient  reports that she has never smoked. She has never used smokeless tobacco. She reports current alcohol use of about 3.0 standard drinks of alcohol per week. She reports that she does not use drugs.   Family History:   family history includes Breast cancer in her cousin; Cancer in her father; Diabetes in her mother and sister; Hypertension in her father.    Review of Systems: Review of Systems  Constitutional:  Negative.   HENT: Negative.    Respiratory: Negative.    Cardiovascular: Negative.   Gastrointestinal: Negative.   Musculoskeletal: Negative.   Neurological:  Positive for loss of consciousness.  Psychiatric/Behavioral: Negative.    All other systems reviewed and are negative.   PHYSICAL EXAM: VS:  There were no vitals taken for this visit. , BMI There is no height or weight on file to calculate BMI. Constitutional:  oriented to person, place, and time. No distress.  HENT:  Head: Grossly normal Eyes:  no discharge. No scleral icterus.  Neck: No JVD, 1+ carotid bruit bilaterally Cardiovascular: Regular rate and rhythm, no murmurs appreciated Pulmonary/Chest: Clear to auscultation bilaterally, no wheezes or rails Abdominal: Soft.  no distension.  no tenderness.  Musculoskeletal: Normal range of motion Neurological:  normal muscle tone. Coordination normal. No atrophy Skin: Skin warm and dry Psychiatric: normal affect, pleasant  Recent Labs: 06/15/2023: ALT 39; BUN 17; Creatinine, Ser 1.19; Hemoglobin 13.7; Platelets 226; Potassium 4.9; Sodium 141; TSH 1.670    Lipid Panel Lab Results  Component Value Date   CHOL 172 06/15/2023   HDL 114 06/15/2023   LDLCALC 47 06/15/2023   TRIG 54 06/15/2023    Wt Readings from Last 3 Encounters:  07/28/23 140 lb 9.6 oz (63.8 kg)  06/15/23 144 lb 3.2 oz (65.4 kg)  06/09/23 142 lb (64.4 kg)     ASSESSMENT AND PLAN:  Problem List Items Addressed This Visit   None  Paroxysmal tachycardia Zio monitor ordered given recent syncope Rather than taking metoprolol succinate 37.5 every other day we have recommended 25 every other day alternating with 12.5 mg If blood pressure runs low we may need to decrease down to 12.5 daily  Essential hypertension Plan as above, metoprolol 25 alternating with 12.5 She admitted to 37.5 every other day Blood pressures running low, often less than 110 30 point drop in blood pressure today with  standing Recommend she stay hydrated For low pressures at home may need to decrease metoprolol down to 12.5 daily  Syncope Suspected secondary to orthostasis Recommend she stay hydrated, avoid playing golf in the heat Metoprolol changes as above Close monitoring of pressure Echocardiogram and Zio monitor ordered  Hyperlipidemia Continue Crestor 20 daily  PAD/carotid stenosis Moderate disease discussed, bruits bilaterally Followed by Dr. Wyn Quaker  Long discussion concerning syncope Recommend she avoid driving until workup is complete  Total encounter time more than 40 minutes  Greater than 50% was spent in counseling and coordination of care with the patient    Signed, Dossie Arbour, M.D., Ph.D. [facility] [place], [place] [phone]

## 2023-10-02 ENCOUNTER — Ambulatory Visit: Payer: Medicare PPO | Admitting: Cardiovascular Disease

## 2023-10-02 ENCOUNTER — Other Ambulatory Visit: Payer: Self-pay | Admitting: Cardiovascular Disease

## 2023-10-02 DIAGNOSIS — I479 Paroxysmal tachycardia, unspecified: Secondary | ICD-10-CM

## 2023-10-02 DIAGNOSIS — I739 Peripheral vascular disease, unspecified: Secondary | ICD-10-CM

## 2023-10-02 DIAGNOSIS — E782 Mixed hyperlipidemia: Secondary | ICD-10-CM

## 2023-10-02 DIAGNOSIS — I6523 Occlusion and stenosis of bilateral carotid arteries: Secondary | ICD-10-CM

## 2023-10-02 DIAGNOSIS — I10 Essential (primary) hypertension: Secondary | ICD-10-CM

## 2023-11-29 NOTE — Progress Notes (Unsigned)
 Cardiology Office Note  Date:  11/30/2023   ID:  Shelia Taylor, DOB 1945/12/08, MRN [id number]  PCP:  Reubin Milan, MD   Chief Complaint  Patient presents with   6 month follow up     "Doing well."     HPI:  [name] is a 78 year old woman with a past medical history of PAD, followed by vascular in [facility] carotid stenosis 40 to 59% disease on the left, less than 39% disease on right Hypertension Hyperlipidemia Episode of syncope June 2024 Who presents for follow-up of her palpitations, hyperlipidemia, PAD, syncope  Last seen in clinic June 2024 Follow-up today reports that she feels well Rare dizzy spells, no near-syncope or syncope Blood pressure stable  Echo reviewed Normal left and right ventricular size and function No significant valvular heart disease  Less active in the cooler weather Likes to play golf in the summer  EKG personally reviewed by myself on todays visit EKG Interpretation Date/Time:  Monday November 30 2023 11:38:00 EST Ventricular Rate:  78 PR Interval:  154 QRS Duration:  84 QT Interval:  394 QTC Calculation: 449 R Axis:   -45  Text Interpretation: Normal sinus rhythm Left anterior fascicular block Minimal voltage criteria for LVH, may be normal variant ( Cornell product ) When compared with ECG of 11-Jul-2019 12:09, PREVIOUS ECG IS PRESENT Confirmed by Julien Nordmann [phone]) on 11/30/2023 12:21:19 PM    Significant events from prior clinic visit, episode of syncope 3 weeks ago,  syncope on the driveway Reports that she was wheeling the garbage can back to the house, got back halfway when she acutely went down in the driveway Reports no presyncope symptoms preceding the event Was not dizzy, no tunnel vision, no GI issues, had no warning Passerby driving by helped her up Skinned left side of face Did not go to the ER, went back into the house to recover  Played golf that Am, feels that she was hydrated Event  happened middle of the afternoon No other episodes  Reports that several weeks ago she started taking metoprolol succinate 37.5 mg every other day Low pressures on her measurements, lowest was 97 systolic, often 107 systolic Blood pressures have persisted even taking the medication every other day in fact it would appear days when she takes the medication night before pressure is lower the next morning less than 110 systolic  Carotid ultrasound reviewed Right Carotid: Velocities in the right ICA are consistent with a 40-59% stenosis.  Left Carotid: Velocities in the left ICA are consistent with a 40-59%  stenosis   PMH:   has a past medical history of Allergy (07/19/19), DUB (dysfunctional uterine bleeding), DUB (dysfunctional uterine bleeding), Endometrial polyp, Hypertension, Mixed hyperlipidemia, Motion sickness, and Osteoporosis.  PSH:    Past Surgical History:  Procedure Laterality Date   APPENDECTOMY     BREAST EXCISIONAL BIOPSY Left 1970's   NEG   CATARACT EXTRACTION, BILATERAL  2017   DILATION AND CURETTAGE OF UTERUS     TUBAL LIGATION      Current Outpatient Medications  Medication Sig Dispense Refill   aspirin 81 MG tablet Take 81 mg by mouth every other day.      Calcium Carbonate-Vitamin D (CALCIUM + D PO) Take by mouth 2 (two) times daily.     metroNIDAZOLE (METROCREAM) 0.75 % cream Apply topically 2 (two) times daily.     Multiple Vitamin (MULTIVITAMIN) tablet Take 1 tablet by mouth daily.     rosuvastatin (CRESTOR)  20 MG tablet TAKE 1 TABLET EVERY DAY 90 tablet 3   metoprolol succinate (TOPROL-XL) 25 MG 24 hr tablet TAKE 1 TABLET ALTERNATING WITH 1/2 TABLET EVERY OTHER DAY. IF PRESSURES RUN LESS THAN 110, DECREASE TO 1/2 TABLET DAILY 68 tablet 3   No current facility-administered medications for this visit.   Allergies:   Propofol   Social History:  The patient  reports that she has never smoked. She has never used smokeless tobacco. She reports current alcohol  use of about 3.0 standard drinks of alcohol per week. She reports that she does not use drugs.   Family History:   family history includes Breast cancer in her cousin; Cancer in her father; Diabetes in her mother and sister; Hypertension in her father.    Review of Systems: Review of Systems  Constitutional: Negative.   HENT: Negative.    Respiratory: Negative.    Cardiovascular: Negative.   Gastrointestinal: Negative.   Musculoskeletal: Negative.   Neurological: Negative.   Psychiatric/Behavioral: Negative.    All other systems reviewed and are negative.   PHYSICAL EXAM: VS:  BP 120/82 (BP Location: Left Arm, Patient Position: Sitting, Cuff Size: Normal)   Pulse 78   Ht 5\' 7"  (1.702 m)   Wt 144 lb 6 oz (65.5 kg)   SpO2 98%   BMI 22.61 kg/m  , BMI Body mass index is 22.61 kg/m. Constitutional:  oriented to person, place, and time. No distress.  HENT:  Head: Grossly normal Eyes:  no discharge. No scleral icterus.  Neck: No JVD, no carotid bruits  Cardiovascular: Regular rate and rhythm, no murmurs appreciated Pulmonary/Chest: Clear to auscultation bilaterally, no wheezes or rails Abdominal: Soft.  no distension.  no tenderness.  Musculoskeletal: Normal range of motion Neurological:  normal muscle tone. Coordination normal. No atrophy Skin: Skin warm and dry Psychiatric: normal affect, pleasant  Recent Labs: 06/15/2023: ALT 39; BUN 17; Creatinine, Ser 1.19; Hemoglobin 13.7; Platelets 226; Potassium 4.9; Sodium 141; TSH 1.670    Lipid Panel Lab Results  Component Value Date   CHOL 172 06/15/2023   HDL 114 06/15/2023   LDLCALC 47 06/15/2023   TRIG 54 06/15/2023    Wt Readings from Last 3 Encounters:  11/30/23 144 lb 6 oz (65.5 kg)  07/28/23 140 lb 9.6 oz (63.8 kg)  06/15/23 144 lb 3.2 oz (65.4 kg)     ASSESSMENT AND PLAN:  Problem List Items Addressed This Visit       Cardiology Problems   Essential hypertension (Chronic)   Relevant Medications    metoprolol succinate (TOPROL-XL) 25 MG 24 hr tablet   Other Relevant Orders   EKG 12-Lead (Completed)   Mixed hyperlipidemia (Chronic)   Relevant Medications   metoprolol succinate (TOPROL-XL) 25 MG 24 hr tablet   Bilateral carotid artery stenosis (Chronic)   Relevant Medications   metoprolol succinate (TOPROL-XL) 25 MG 24 hr tablet   Other Relevant Orders   EKG 12-Lead (Completed)   PAD (peripheral artery disease) (HCC) - Primary (Chronic)   Relevant Medications   metoprolol succinate (TOPROL-XL) 25 MG 24 hr tablet   Other Relevant Orders   EKG 12-Lead (Completed)   Paroxysmal tachycardia (HCC)   Relevant Medications   metoprolol succinate (TOPROL-XL) 25 MG 24 hr tablet   Other Relevant Orders   EKG 12-Lead (Completed)   Other Visit Diagnoses       Syncope, unspecified syncope type          Paroxysmal tachycardia Prior Zio monitor with no  significant arrhythmia noted Recommend she continue metoprolol 25 daily alternating every other day with 12.5 mg, blood pressure stable No near syncope  Essential hypertension Blood pressure is well controlled on today's visit. No changes made to the medications.  Syncope Had played golf that morning, Suspected secondary to orthostasis Echocardiogram normal,and Zio monitor no arrhythmia  Hyperlipidemia Continue Crestor 20 daily LDL at goal  PAD/carotid stenosis Moderate disease discussed, bruits bilaterally 40- to 59% on the left Followed by Dr. Wyn Quaker   Signed, Dossie Arbour, M.D., Ph.D. [facility] [place], [place] [phone]

## 2023-11-30 ENCOUNTER — Ambulatory Visit: Payer: Medicare PPO | Attending: Cardiovascular Disease | Admitting: Cardiovascular Disease

## 2023-11-30 ENCOUNTER — Encounter: Payer: Self-pay | Admitting: Cardiovascular Disease

## 2023-11-30 VITALS — BP 120/82 | HR 78 | Ht 67.0 in | Wt 144.4 lb

## 2023-11-30 DIAGNOSIS — I479 Paroxysmal tachycardia, unspecified: Secondary | ICD-10-CM | POA: Diagnosis not present

## 2023-11-30 DIAGNOSIS — I6523 Occlusion and stenosis of bilateral carotid arteries: Secondary | ICD-10-CM | POA: Diagnosis not present

## 2023-11-30 DIAGNOSIS — I739 Peripheral vascular disease, unspecified: Secondary | ICD-10-CM | POA: Diagnosis not present

## 2023-11-30 DIAGNOSIS — R55 Syncope and collapse: Secondary | ICD-10-CM

## 2023-11-30 DIAGNOSIS — E782 Mixed hyperlipidemia: Secondary | ICD-10-CM

## 2023-11-30 DIAGNOSIS — I10 Essential (primary) hypertension: Secondary | ICD-10-CM

## 2023-11-30 MED ORDER — METOPROLOL SUCCINATE ER 25 MG PO TB24: ORAL_TABLET | ORAL | 3 refills | Status: AC

## 2023-11-30 NOTE — Patient Instructions (Signed)

## 2024-02-23 ENCOUNTER — Encounter (INDEPENDENT_AMBULATORY_CARE_PROVIDER_SITE_OTHER): Payer: Self-pay

## 2024-06-15 ENCOUNTER — Ambulatory Visit (INDEPENDENT_AMBULATORY_CARE_PROVIDER_SITE_OTHER): Payer: Self-pay | Admitting: Internal Medicine

## 2024-06-15 ENCOUNTER — Encounter: Payer: Self-pay | Admitting: Internal Medicine

## 2024-06-15 VITALS — BP 112/66 | HR 80 | Ht 67.0 in | Wt 145.0 lb

## 2024-06-15 DIAGNOSIS — E782 Mixed hyperlipidemia: Secondary | ICD-10-CM | POA: Diagnosis not present

## 2024-06-15 DIAGNOSIS — Z23 Encounter for immunization: Secondary | ICD-10-CM | POA: Diagnosis not present

## 2024-06-15 DIAGNOSIS — Z0000 Encounter for general adult medical examination without abnormal findings: Secondary | ICD-10-CM

## 2024-06-15 DIAGNOSIS — M810 Age-related osteoporosis without current pathological fracture: Secondary | ICD-10-CM

## 2024-06-15 DIAGNOSIS — I10 Essential (primary) hypertension: Secondary | ICD-10-CM

## 2024-06-15 DIAGNOSIS — Z1231 Encounter for screening mammogram for malignant neoplasm of breast: Secondary | ICD-10-CM

## 2024-06-15 DIAGNOSIS — I479 Paroxysmal tachycardia, unspecified: Secondary | ICD-10-CM | POA: Diagnosis not present

## 2024-06-15 DIAGNOSIS — I6523 Occlusion and stenosis of bilateral carotid arteries: Secondary | ICD-10-CM

## 2024-06-15 MED ORDER — COVID-19 MRNA VAC-TRIS(PFIZER) 30 MCG/0.3ML IM SUSY: 0.3000 mL | PREFILLED_SYRINGE | Freq: Once | INTRAMUSCULAR | 0 refills | Status: AC

## 2024-06-15 NOTE — Progress Notes (Signed)
 Date:  06/15/2024   Name:  Shelia Taylor   DOB:  06-04-1946   MRN:  [id number]   Chief Complaint: Annual Exam and Hypertension Shelia Taylor is a 78 y.o. female who presents today for her Complete Annual Exam. She feels well. She reports exercising. She reports she is sleeping well. Breast complaints - none.  Health Maintenance  Topic Date Due   Zoster (Shingles) Vaccine (1 of 2) Never done   DTaP/Tdap/Td vaccine (2 - Tdap) 09/05/2018   COVID-19 Vaccine (5 - 2025-26 season) 06/06/2024   Medicare Annual Wellness Visit  06/08/2024   Flu Shot  01/03/2025*   Mammogram  08/25/2024   Pneumococcal Vaccine for age over 21  Completed   DEXA scan (bone density measurement)  Completed   Hepatitis C Screening  Completed   HPV Vaccine  Aged Out   Meningitis B Vaccine  Aged Out   Colon Cancer Screening  Discontinued  *Topic was postponed. The date shown is not the original due date.    Hypertension This is a chronic problem. The problem is controlled. Associated symptoms include palpitations. Pertinent negatives include no chest pain, headaches or shortness of breath. Past treatments include beta blockers. Hypertensive end-organ damage includes PVD. There is no history of kidney disease or CAD/MI.  Hyperlipidemia This is a chronic problem. The problem is controlled. Pertinent negatives include no chest pain, myalgias or shortness of breath. Current antihyperlipidemic treatment includes statins. The current treatment provides significant improvement of lipids.    Review of Systems  Constitutional:  Negative for fatigue and unexpected weight change.  HENT:  Negative for trouble swallowing.   Eyes:  Negative for visual disturbance.  Respiratory:  Negative for cough, chest tightness, shortness of breath and wheezing.   Cardiovascular:  Positive for palpitations. Negative for chest pain and leg swelling.  Gastrointestinal:  Negative for abdominal pain, constipation and diarrhea.   Musculoskeletal:  Negative for arthralgias and myalgias.  Neurological:  Negative for dizziness, weakness, light-headedness and headaches.     Lab Results  Component Value Date   NA 141 06/15/2023   K 4.9 06/15/2023   CO2 20 06/15/2023   GLUCOSE 104 (H) 06/15/2023   BUN 17 06/15/2023   CREATININE 1.19 (H) 06/15/2023   CALCIUM  9.7 06/15/2023   EGFR 48 (L) 06/15/2023   GFRNONAA 52 (L) 11/15/2020   Lab Results  Component Value Date   CHOL 172 06/15/2023   HDL 114 06/15/2023   LDLCALC 47 06/15/2023   TRIG 54 06/15/2023   CHOLHDL 1.5 06/15/2023   Lab Results  Component Value Date   TSH 1.670 06/15/2023   No results found for: HGBA1C Lab Results  Component Value Date   WBC 5.8 06/15/2023   HGB 13.7 06/15/2023   HCT 40.9 06/15/2023   MCV 97 06/15/2023   PLT 226 06/15/2023   Lab Results  Component Value Date   ALT 39 (H) 06/15/2023   AST 45 (H) 06/15/2023   ALKPHOS 66 06/15/2023   BILITOT 0.5 06/15/2023   Lab Results  Component Value Date   VD25OH 34.9 06/07/2021     Patient Active Problem List   Diagnosis Date Noted   Paroxysmal tachycardia (HCC) 06/11/2022   PAD (peripheral artery disease) (HCC) 12/05/2021   Essential hypertension 05/20/2019   Mixed hyperlipidemia 05/20/2019   Bilateral carotid artery stenosis 05/20/2019   Family history of colon cancer 05/20/2019   Endometrial polyp    Osteoporosis of multiple sites     Allergies  Allergen Reactions   Propofol  Other (See Comments)    Altered Mental Status after propofol  given prior to colonoscopy on 07/11/2019, per MD documentation likely not seizure.     Past Surgical History:  Procedure Laterality Date   APPENDECTOMY     BREAST EXCISIONAL BIOPSY Left 1970's   NEG   CATARACT EXTRACTION, BILATERAL  2017   DILATION AND CURETTAGE OF UTERUS     TUBAL LIGATION      Social History   Tobacco Use   Smoking status: Never   Smokeless tobacco: Never  Vaping Use   Vaping status: Never Used   Substance Use Topics   Alcohol use: Yes    Alcohol/week: 3.0 standard drinks of alcohol    Types: 3 Glasses of wine per week    Comment: 2 glasses per day   Drug use: Never     Medication list has been reviewed and updated.  Current Meds  Medication Sig   aspirin 81 MG tablet Take 81 mg by mouth every other day.    Calcium  Carbonate-Vitamin D (CALCIUM  + D PO) Take by mouth 2 (two) times daily.   COVID-19 mRNA vaccine, Pfizer, (COMIRNATY) syringe Inject 0.3 mLs into the muscle once for 1 dose.   metoprolol  succinate (TOPROL -XL) 25 MG 24 hr tablet TAKE 1 TABLET ALTERNATING WITH 1/2 TABLET EVERY OTHER DAY. IF PRESSURES RUN LESS THAN 110, DECREASE TO 1/2 TABLET DAILY   metroNIDAZOLE (METROCREAM) 0.75 % cream Apply topically 2 (two) times daily.   Multiple Vitamin (MULTIVITAMIN) tablet Take 1 tablet by mouth daily.   rosuvastatin  (CRESTOR ) 20 MG tablet TAKE 1 TABLET EVERY DAY       06/15/2024    8:16 AM 06/15/2023    8:40 AM 12/10/2022    8:22 AM 06/11/2022    8:44 AM  GAD 7 : Generalized Anxiety Score  Nervous, Anxious, on Edge 0 0 0 0  Control/stop worrying 0 0 0 0  Worry too much - different things 0 0 0 0  Trouble relaxing 0 0 0 0  Restless 0 0 0 0  Easily annoyed or irritable 0 0 0 0  Afraid - awful might happen 0 0 0 0  Total GAD 7 Score 0 0 0 0  Anxiety Difficulty Not difficult at all Not difficult at all Not difficult at all Not difficult at all       06/15/2024    8:16 AM 06/15/2023    8:40 AM 06/09/2023    8:09 AM  Depression screen PHQ 2/9  Decreased Interest 0 0 0  Down, Depressed, Hopeless 0 0 0  PHQ - 2 Score 0 0 0  Altered sleeping 0 0 0  Tired, decreased energy 0 0 0  Change in appetite 0 0 0  Feeling bad or failure about yourself  0 0 0  Trouble concentrating 0 0 0  Moving slowly or fidgety/restless 0 0 0  Suicidal thoughts 0 0 0  PHQ-9 Score 0 0 0  Difficult doing work/chores Not difficult at all Not difficult at all Not difficult at all    BP Readings  from Last 3 Encounters:  06/15/24 112/66  11/30/23 120/82  07/28/23 (!) 149/72    Physical Exam Vitals and nursing note reviewed.  Constitutional:      General: She is not in acute distress.    Appearance: She is well-developed.  HENT:     Head: Normocephalic and atraumatic.     Right Ear: Tympanic membrane and ear canal normal.  Left Ear: Tympanic membrane and ear canal normal.     Nose:     Right Sinus: No maxillary sinus tenderness.     Left Sinus: No maxillary sinus tenderness.  Eyes:     General: No scleral icterus.       Right eye: No discharge.        Left eye: No discharge.     Conjunctiva/sclera: Conjunctivae normal.  Neck:     Thyroid : No thyromegaly.     Vascular: No carotid bruit.  Cardiovascular:     Rate and Rhythm: Normal rate and regular rhythm.     Pulses: Normal pulses.     Heart sounds: Normal heart sounds.  Pulmonary:     Effort: Pulmonary effort is normal. No respiratory distress.     Breath sounds: No wheezing.  Abdominal:     General: Bowel sounds are normal.     Palpations: Abdomen is soft.     Tenderness: There is no abdominal tenderness.  Musculoskeletal:     Cervical back: Normal range of motion. No erythema.     Right lower leg: No edema.     Left lower leg: No edema.  Lymphadenopathy:     Cervical: No cervical adenopathy.  Skin:    General: Skin is warm and dry.     Findings: No rash.  Neurological:     Mental Status: She is alert and oriented to person, place, and time.     Cranial Nerves: No cranial nerve deficit.     Sensory: No sensory deficit.     Deep Tendon Reflexes: Reflexes are normal and symmetric.  Psychiatric:        Attention and Perception: Attention normal.        Mood and Affect: Mood normal.     Wt Readings from Last 3 Encounters:  06/15/24 145 lb (65.8 kg)  11/30/23 144 lb 6 oz (65.5 kg)  07/28/23 140 lb 9.6 oz (63.8 kg)    BP 112/66   Pulse 80   Ht 5' 7 (1.702 m)   Wt 145 lb (65.8 kg)   SpO2 98%    BMI 22.71 kg/m   Assessment and Plan:  Problem List Items Addressed This Visit       Unprioritized   Osteoporosis of multiple sites (Chronic)   DEXA 2024 - improved bone density with normal spine and osteopenia hip. Will continue calcium  and vitamin D.      Essential hypertension (Chronic)   Blood pressure is well controlled on metoprolol  25 mg alternate with 12.5 mg. No medication side effects noted. Plan to continue current medications.       Relevant Orders   CBC with Differential/Platelet   Comprehensive metabolic panel with GFR   Urinalysis, Routine w reflex microscopic   Mixed hyperlipidemia (Chronic)   LDL is  Lab Results  Component Value Date   LDLCALC 47 06/15/2023   Currently taking Crestor .  No medication side effects or other concerns. Recommended LDL goal is < 55.       Relevant Orders   Lipid panel   Bilateral carotid artery stenosis (Chronic)   Has follow up yearly with Dr. Marea for repeat carotid US  Continue ASA and statin      Relevant Orders   Lipid panel   Paroxysmal tachycardia (HCC)   Followed by Cardiology.  Symptoms controlled on metoprolol  25 mg daily. If BP is low, she takes only 1/2 tablet.      Relevant Orders   TSH  Other Visit Diagnoses       Annual physical exam    -  Primary   up to date on screening and immunizations will get Flu and Covid later at [facility] continue regular exercise and healthy diet     Encounter for screening mammogram for breast cancer       schedule at [facility]   Relevant Orders   MM 3D SCREENING MAMMOGRAM BILATERAL BREAST     Need for vaccination       Relevant Medications   COVID-19 mRNA vaccine, Pfizer, (COMIRNATY) syringe       Return in about 6 months (around 12/13/2024) for TOC HTN Dr. Lemon.    Leita HILARIO Adie, MD [facility] [place]

## 2024-06-15 NOTE — Assessment & Plan Note (Addendum)
 Blood pressure is well controlled on metoprolol  25 mg alternate with 12.5 mg. No medication side effects noted. Plan to continue current medications.

## 2024-06-15 NOTE — Assessment & Plan Note (Signed)
 Has follow up yearly with Dr. Marea for repeat carotid US  Continue ASA and statin

## 2024-06-15 NOTE — Assessment & Plan Note (Signed)
 DEXA 2024 - improved bone density with normal spine and osteopenia hip. Will continue calcium  and vitamin D.

## 2024-06-15 NOTE — Assessment & Plan Note (Signed)
 Followed by Cardiology.  Symptoms controlled on metoprolol  25 mg daily. If BP is low, she takes only 1/2 tablet.

## 2024-06-15 NOTE — Patient Instructions (Signed)
 Call [facility] to schedule your mammogram at [phone].

## 2024-06-15 NOTE — Assessment & Plan Note (Signed)
 LDL is  Lab Results  Component Value Date   LDLCALC 47 06/15/2023   Currently taking Crestor .  No medication side effects or other concerns. Recommended LDL goal is < 55.

## 2024-06-16 ENCOUNTER — Ambulatory Visit: Payer: Self-pay | Admitting: Internal Medicine

## 2024-06-16 LAB — CBC WITH DIFFERENTIAL/PLATELET
Basophils Absolute: 0 x10E3/uL (ref 0.0–0.2)
Basos: 1 %
EOS (ABSOLUTE): 0.1 x10E3/uL (ref 0.0–0.4)
Eos: 2 %
Hematocrit: 42.3 % (ref 34.0–46.6)
Hemoglobin: 13.9 g/dL (ref 11.1–15.9)
Immature Grans (Abs): 0 x10E3/uL (ref 0.0–0.1)
Immature Granulocytes: 0 %
Lymphocytes Absolute: 1.2 x10E3/uL (ref 0.7–3.1)
Lymphs: 19 %
MCH: 32.9 pg (ref 26.6–33.0)
MCHC: 32.9 g/dL (ref 31.5–35.7)
MCV: 100 fL — ABNORMAL HIGH (ref 79–97)
Monocytes Absolute: 0.8 x10E3/uL (ref 0.1–0.9)
Monocytes: 13 %
Neutrophils Absolute: 4.2 x10E3/uL (ref 1.4–7.0)
Neutrophils: 64 %
Platelets: 263 x10E3/uL (ref 150–450)
RBC: 4.22 x10E6/uL (ref 3.77–5.28)
RDW: 12.8 % (ref 11.7–15.4)
WBC: 6.4 x10E3/uL (ref 3.4–10.8)

## 2024-06-16 LAB — LIPID PANEL
Chol/HDL Ratio: 1.6 ratio (ref 0.0–4.4)
Cholesterol, Total: 161 mg/dL (ref 100–199)
HDL: 98 mg/dL (ref >39)
LDL Chol Calc (NIH): 51 mg/dL (ref 0–99)
Triglycerides: 56 mg/dL (ref 0–149)
VLDL Cholesterol Cal: 12 mg/dL (ref 5–40)

## 2024-06-16 LAB — URINALYSIS, ROUTINE W REFLEX MICROSCOPIC
Bilirubin, UA: NEGATIVE
Glucose, UA: NEGATIVE
Leukocytes,UA: NEGATIVE
Nitrite, UA: NEGATIVE
RBC, UA: NEGATIVE
Specific Gravity, UA: 1.024 (ref 1.005–1.030)
Urobilinogen, Ur: 1 mg/dL (ref 0.2–1.0)
pH, UA: 5 (ref 5.0–7.5)

## 2024-06-16 LAB — COMPREHENSIVE METABOLIC PANEL WITH GFR
ALT: 26 IU/L (ref 0–32)
AST: 33 IU/L (ref 0–40)
Albumin: 4.7 g/dL (ref 3.8–4.8)
Alkaline Phosphatase: 67 IU/L (ref 44–121)
BUN/Creatinine Ratio: 16 (ref 12–28)
BUN: 18 mg/dL (ref 8–27)
Bilirubin Total: 0.5 mg/dL (ref 0.0–1.2)
CO2: 18 mmol/L — ABNORMAL LOW (ref 20–29)
Calcium: 9.9 mg/dL (ref 8.7–10.3)
Chloride: 103 mmol/L (ref 96–106)
Creatinine, Ser: 1.16 mg/dL — ABNORMAL HIGH (ref 0.57–1.00)
Globulin, Total: 2.6 g/dL (ref 1.5–4.5)
Glucose: 89 mg/dL (ref 70–99)
Potassium: 4.9 mmol/L (ref 3.5–5.2)
Sodium: 140 mmol/L (ref 134–144)
Total Protein: 7.3 g/dL (ref 6.0–8.5)
eGFR: 49 mL/min/1.73 — ABNORMAL LOW (ref >59)

## 2024-06-16 LAB — MICROSCOPIC EXAMINATION: Bacteria, UA: NONE SEEN

## 2024-06-16 LAB — TSH: TSH: 1.32 u[IU]/mL (ref 0.450–4.500)

## 2024-06-22 ENCOUNTER — Ambulatory Visit

## 2024-07-07 ENCOUNTER — Ambulatory Visit: Admitting: Emergency Medicine

## 2024-07-07 VITALS — Ht 67.0 in | Wt 144.0 lb

## 2024-07-07 DIAGNOSIS — Z0000 Encounter for general adult medical examination without abnormal findings: Secondary | ICD-10-CM | POA: Diagnosis not present

## 2024-07-07 NOTE — Patient Instructions (Signed)
 Shelia Taylor,  Thank you for taking the time for your Medicare Wellness Visit. I appreciate your continued commitment to your health goals. Please review the care plan we discussed, and feel free to reach out if I can assist you further.  Medicare recommends these wellness visits once per year to help you and your care team stay ahead of potential health issues. These visits are designed to focus on prevention, allowing your provider to concentrate on managing your acute and chronic conditions during your regular appointments.  Please note that Annual Wellness Visits do not include a physical exam. Some assessments may be limited, especially if the visit was conducted virtually. If needed, we may recommend a separate in-person follow-up with your provider.  Ongoing Care Seeing your primary care provider every 3 to 6 months helps us  monitor your health and provide consistent, personalized care.   Referrals If a referral was made during today's visit and you haven't received any updates within two weeks, please contact the referred provider directly to check on the status.  Recommended Screenings: Get the flu and covid vaccines at your convenience. Keep up the good work!  Health Maintenance  Topic Date Due   DTaP/Tdap/Td vaccine (2 - Tdap) 09/05/2018   COVID-19 Vaccine (6 - 2025-26 season) 06/06/2024   Breast Cancer Screening  08/25/2024   Flu Shot  01/03/2025*   Medicare Annual Wellness Visit  07/07/2025   DEXA scan (bone density measurement)  08/25/2025   Pneumococcal Vaccine for age over 60  Completed   Hepatitis C Screening  Completed   Zoster (Shingles) Vaccine  Completed   HPV Vaccine  Aged Out   Meningitis B Vaccine  Aged Out   Colon Cancer Screening  Discontinued  *Topic was postponed. The date shown is not the original due date.       07/07/2024   10:07 AM  Advanced Directives  Does Patient Have a Medical Advance Directive? No  Would patient like information on creating a  medical advance directive? No - Patient declined   Advance Care Planning is important because it: Ensures you receive medical care that aligns with your values, goals, and preferences. Provides guidance to your family and loved ones, reducing the emotional burden of decision-making during critical moments.  Vision: Annual vision screenings are recommended for early detection of glaucoma, cataracts, and diabetic retinopathy. These exams can also reveal signs of chronic conditions such as diabetes and high blood pressure.  Dental: Annual dental screenings help detect early signs of oral cancer, gum disease, and other conditions linked to overall health, including heart disease and diabetes.  Please see the attached documents for additional preventive care recommendations.   Fall Prevention in the Home, Adult Falls can cause injuries and affect people of all ages. There are many simple things that you can do to make your home safe and to help prevent falls. If you need it, ask for help making these changes. What actions can I take to prevent falls? General information Use good lighting in all rooms. Make sure to: Replace any light bulbs that burn out. Turn on lights if it is dark and use night-lights. Keep items that you use often in easy-to-reach places. Lower the shelves around your home if needed. Move furniture so that there are clear paths around it. Do not keep throw rugs or other things on the floor that can make you trip. If any of your floors are uneven, fix them. Add color or contrast paint or tape to clearly  mark and help you see: Grab bars or handrails. First and last steps of staircases. Where the edge of each step is. If you use a ladder or stepladder: Make sure that it is fully opened. Do not climb a closed ladder. Make sure the sides of the ladder are locked in place. Have someone hold the ladder while you use it. Know where your pets are as you move through your  home. What can I do in the bathroom?     Keep the floor dry. Clean up any water that is on the floor right away. Remove soap buildup in the bathtub or shower. Buildup makes bathtubs and showers slippery. Use non-skid mats or decals on the floor of the bathtub or shower. Attach bath mats securely with double-sided, non-slip rug tape. If you need to sit down while you are in the shower, use a non-slip stool. Install grab bars by the toilet and in the bathtub and shower. Do not use towel bars as grab bars. What can I do in the bedroom? Make sure that you have a light by your bed that is easy to reach. Do not use any sheets or blankets on your bed that hang to the floor. Have a firm bench or chair with side arms that you can use for support when you get dressed. What can I do in the kitchen? Clean up any spills right away. If you need to reach something above you, use a sturdy step stool that has a grab bar. Keep electrical cables out of the way. Do not use floor polish or wax that makes floors slippery. What can I do with my stairs? Do not leave anything on the stairs. Make sure that you have a light switch at the top and the bottom of the stairs. Have them installed if you do not have them. Make sure that there are handrails on both sides of the stairs. Fix handrails that are broken or loose. Make sure that handrails are as long as the staircases. Install non-slip stair treads on all stairs in your home if they do not have carpet. Avoid having throw rugs at the top or bottom of stairs, or secure the rugs with carpet tape to prevent them from moving. Choose a carpet design that does not hide the edge of steps on the stairs. Make sure that carpet is firmly attached to the stairs. Fix any carpet that is loose or worn. What can I do on the outside of my home? Use bright outdoor lighting. Repair the edges of walkways and driveways and fix any cracks. Clear paths of anything that can make you  trip, such as tools or rocks. Add color or contrast paint or tape to clearly mark and help you see high doorway thresholds. Trim any bushes or trees on the main path into your home. Check that handrails are securely fastened and in good repair. Both sides of all steps should have handrails. Install guardrails along the edges of any raised decks or porches. Have leaves, snow, and ice cleared regularly. Use sand, salt, or ice melt on walkways during winter months if you live where there is ice and snow. In the garage, clean up any spills right away, including grease or oil spills. What other actions can I take? Review your medicines with your health care provider. Some medicines can make you confused or feel dizzy. This can increase your chance of falling. Wear closed-toe shoes that fit well and support your feet. Wear shoes that  have rubber soles and low heels. Use a cane, walker, scooter, or crutches that help you move around if needed. Talk with your provider about other ways that you can decrease your risk of falls. This may include seeing a physical therapist to learn to do exercises to improve movement and strength. Where to find more information Centers for Disease Control and Prevention, [employer]: TonerPromos.no [employer] on Aging: BaseRingTones.pl National Institute on Aging: BaseRingTones.pl Contact a health care provider if: You are afraid of falling at home. You feel weak, drowsy, or dizzy at home. You fall at home. Get help right away if you: Lose consciousness or have trouble moving after a fall. Have a fall that causes a head injury. These symptoms may be an emergency. Get help right away. Call 911. Do not wait to see if the symptoms will go away. Do not drive yourself to the hospital. This information is not intended to replace advice given to you by your health care provider. Make sure you discuss any questions you have with your health care provider. Document Revised: 05/26/2022  Document Reviewed: 05/26/2022 Elsevier Patient Education  2024 [employer].

## 2024-07-07 NOTE — Progress Notes (Signed)
 Subjective:   Shelia Taylor is a 78 y.o. who presents for a Medicare Wellness preventive visit.  As a reminder, Annual Wellness Visits don't include a physical exam, and some assessments may be limited, especially if this visit is performed virtually. We may recommend an in-person follow-up visit with your provider if needed.  Visit Complete: Virtual I connected with  Shelia Taylor on 07/07/24 by a audio enabled telemedicine application and verified that I am speaking with the correct person using two identifiers.  Patient Location: Home  Provider Location: Home Office  I discussed the limitations of evaluation and management by telemedicine. The patient expressed understanding and agreed to proceed.  Vital Signs: Because this visit was a virtual/telehealth visit, some criteria may be missing or patient reported. Any vitals not documented were not able to be obtained and vitals that have been documented are patient reported.  VideoDeclined- This patient declined Librarian, academic. Therefore the visit was completed with audio only.  Persons Participating in Visit: Patient.  AWV Questionnaire: Yes: Patient Medicare AWV questionnaire was completed by the patient on 07/04/24; I have confirmed that all information answered by patient is correct and no changes since this date.  Cardiac Risk Factors include: advanced age (>41men, >17 women);dyslipidemia;hypertension     Objective:    Today's Vitals   07/07/24 0959  Weight: 144 lb (65.3 kg)  Height: 5' 7 (1.702 m)  PainSc: 0-No pain   Body mass index is 22.55 kg/m.     07/07/2024   10:07 AM 06/09/2023    8:11 AM 05/28/2022    9:21 AM 05/27/2021    9:33 AM 05/23/2020    9:27 AM 07/11/2019   12:11 PM 07/11/2019   10:00 AM  Advanced Directives  Does Patient Have a Medical Advance Directive? No No No No No No No  Does patient want to make changes to medical advance directive?       No -  Patient declined  Would patient like information on creating a medical advance directive? No - Patient declined No - Patient declined No - Patient declined No - Patient declined No - Patient declined      Current Medications (verified) Outpatient Encounter Medications as of 07/07/2024  Medication Sig   aspirin 81 MG tablet Take 81 mg by mouth every other day.    Calcium  Carbonate-Vitamin D (CALCIUM  + D PO) Take by mouth 2 (two) times daily.   metoprolol  succinate (TOPROL -XL) 25 MG 24 hr tablet TAKE 1 TABLET ALTERNATING WITH 1/2 TABLET EVERY OTHER DAY. IF PRESSURES RUN LESS THAN 110, DECREASE TO 1/2 TABLET DAILY   metroNIDAZOLE (METROCREAM) 0.75 % cream Apply topically 2 (two) times daily. (Patient taking differently: Apply topically 2 (two) times daily. Uses once daily in the morning)   Multiple Vitamin (MULTIVITAMIN) tablet Take 1 tablet by mouth daily.   rosuvastatin  (CRESTOR ) 20 MG tablet TAKE 1 TABLET EVERY DAY   No facility-administered encounter medications on file as of 07/07/2024.    Allergies (verified) Propofol    History: Past Medical History:  Diagnosis Date   Allergy 07/19/19   DUB (dysfunctional uterine bleeding)    DUB (dysfunctional uterine bleeding)    Endometrial polyp    Hypertension    Mixed hyperlipidemia    Motion sickness    back seat cars   Osteoporosis    Past Surgical History:  Procedure Laterality Date   APPENDECTOMY     BREAST EXCISIONAL BIOPSY Left 1970's   NEG  CATARACT EXTRACTION, BILATERAL  2017   DILATION AND CURETTAGE OF UTERUS     TUBAL LIGATION     Family History  Problem Relation Age of Onset   Diabetes Mother    Hypertension Father    Cancer Father        colon cancer   Diabetes Sister    Breast cancer Cousin        Paternal 1st cousin   Social History   Socioeconomic History   Marital status: Widowed    Spouse name: Not on file   Number of children: 0   Years of education: Not on file   Highest education level: Not on  file  Occupational History   Occupation: retired  Tobacco Use   Smoking status: Never   Smokeless tobacco: Never  Vaping Use   Vaping status: Never Used  Substance and Sexual Activity   Alcohol use: Yes    Alcohol/week: 7.0 standard drinks of alcohol    Types: 7 Glasses of wine per week    Comment: 1 glass of wine daily   Drug use: Never   Sexual activity: Not Currently    Birth control/protection: Surgical  Other Topics Concern   Not on file  Social History Narrative   Pt lives alone   Social Drivers of Health   Financial Resource Strain: Low Risk  (07/07/2024)   Overall Financial Resource Strain (CARDIA)    Difficulty of Paying Living Expenses: Not hard at all  Food Insecurity: No Food Insecurity (07/07/2024)   Hunger Vital Sign    Worried About Running Out of Food in the Last Year: Never true    Ran Out of Food in the Last Year: Never true  Transportation Needs: No Transportation Needs (07/07/2024)   PRAPARE - Administrator, Civil Service (Medical): No    Lack of Transportation (Non-Medical): No  Physical Activity: Sufficiently Active (07/07/2024)   Exercise Vital Sign    Days of Exercise per Week: 5 days    Minutes of Exercise per Session: 90 min  Stress: No Stress Concern Present (07/07/2024)   [employer] of Occupational Health - Occupational Stress Questionnaire    Feeling of Stress: Not at all  Social Connections: Socially Isolated (07/07/2024)   Social Connection and Isolation Panel    Frequency of Communication with Friends and Family: Three times a week    Frequency of Social Gatherings with Friends and Family: More than three times a week    Attends Religious Services: Never    Database administrator or Organizations: No    Attends Banker Meetings: Never    Marital Status: Widowed    Tobacco Counseling Counseling given: Not Answered    Clinical Intake:  Pre-visit preparation completed: Yes  Pain : No/denies pain Pain  Score: 0-No pain     BMI - recorded: 22.55 Nutritional Status: BMI of 19-24  Normal Nutritional Risks: None Diabetes: No  No results found for: HGBA1C   How often do you need to have someone help you when you read instructions, pamphlets, or other written materials from your doctor or pharmacy?: 1 - Never  Interpreter Needed?: No  Information entered by :: Vina Ned, CMA   Activities of Daily Living     07/07/2024   10:00 AM 07/04/2024    5:06 AM  In your present state of health, do you have any difficulty performing the following activities:  Hearing? 0 0  Vision? 0 0  Difficulty concentrating or  making decisions? 0 0  Walking or climbing stairs? 0 0  Dressing or bathing? 0 0  Doing errands, shopping? 0 0  Preparing Food and eating ? N N  Using the Toilet? N N  In the past six months, have you accidently leaked urine? N N  Do you have problems with loss of bowel control? N N  Managing your Medications? N N  Managing your Finances? N N  Housekeeping or managing your Housekeeping? N N    Patient Care Team: Justus Leita DEL, MD as PCP - General (Internal Medicine) Isenstein, Arin L, MD (Dermatology) Gollan, Timothy J, MD as Consulting Physician (Cardiology) Myrna Adine Anes, MD as Consulting Physician (Ophthalmology) Marea Selinda RAMAN, MD as Referring Physician (Vascular Surgery)  I have updated your Care Teams any recent Medical Services you may have received from other providers in the past year.     Assessment:   This is a routine wellness examination for Shelia Taylor.  Hearing/Vision screen Hearing Screening - Comments:: Denies hearing loss  Vision Screening - Comments:: Gets routine eye exams, Dr. Adine Myrna [place]   Goals Addressed             This Visit's Progress    Patient Stated       Lose 5-10 lbs       Depression Screen     07/07/2024   10:05 AM 06/15/2024    8:16 AM 06/15/2023    8:40 AM 06/09/2023    8:09 AM 12/10/2022    8:22 AM  06/11/2022    8:44 AM 05/28/2022    9:29 AM  PHQ 2/9 Scores  PHQ - 2 Score 0 0 0 0 0 0 0  PHQ- 9 Score 0 0 0 0 1 2 1     Fall Risk     07/07/2024   10:08 AM 07/04/2024    5:06 AM 06/15/2024    8:15 AM 06/15/2023    8:40 AM 06/09/2023    8:12 AM  Fall Risk   Falls in the past year? 0 0 0 0 0  Number falls in past yr: 0  0 0 0  Injury with Fall? 0 0 0 0 0  Risk for fall due to : No Fall Risks  No Fall Risks No Fall Risks No Fall Risks  Follow up Falls evaluation completed  Falls evaluation completed Falls evaluation completed Falls prevention discussed;Falls evaluation completed    MEDICARE RISK AT HOME:  Medicare Risk at Home Any stairs in or around the home?: Yes If so, are there any without handrails?: Yes (is in the process of getting handrails) Home free of loose throw rugs in walkways, pet beds, electrical cords, etc?: Yes Adequate lighting in your home to reduce risk of falls?: Yes Life alert?: No Use of a cane, walker or w/c?: No Grab bars in the bathroom?: Yes Shower chair or bench in shower?: No Elevated toilet seat or a handicapped toilet?: Yes  TIMED UP AND GO:  Was the test performed?  No  Cognitive Function: 6CIT completed        07/07/2024   10:09 AM 06/09/2023    8:13 AM 05/28/2022    9:22 AM 05/23/2020    9:29 AM 05/20/2019    2:37 PM  6CIT Screen  What Year? 0 points 0 points 0 points 0 points 0 points  What month? 0 points 0 points 0 points 0 points 0 points  What time? 0 points 0 points 0 points 0 points 0  points  Count back from 20 0 points 0 points 0 points 0 points 0 points  Months in reverse 0 points 0 points 0 points 0 points 0 points  Repeat phrase 0 points 0 points 0 points 0 points 0 points  Total Score 0 points 0 points 0 points 0 points 0 points    Immunizations Immunization History  Administered Date(s) Administered   Fluad Quad(high Dose 65+) 08/26/2021   INFLUENZA, HIGH DOSE SEASONAL PF 08/13/2018   Influenza-Unspecified 07/28/2019,  07/06/2020, 07/25/2023   Moderna Sars-Covid-2 Vaccination 07/25/2023   PFIZER Comirnaty(Gray Top)Covid-19 Tri-Sucrose Vaccine 03/05/2021   PFIZER(Purple Top)SARS-COV-2 Vaccination 02/03/2020, 03/06/2020, 10/02/2020   Pneumococcal Conjugate-13 01/31/2015   Pneumococcal Polysaccharide-23 08/07/1997, 11/15/2012   Rsv, Mab, [place], 0.5 Ml, Neonate To 24 Mos(Beyfortus) 09/26/2023   Td 09/05/2008   Zoster Recombinant(Shingrix) 06/23/2023, 08/29/2023    Screening Tests Health Maintenance  Topic Date Due   DTaP/Tdap/Td (2 - Tdap) 09/05/2018   COVID-19 Vaccine (6 - 2025-26 season) 06/06/2024   Mammogram  08/25/2024   Influenza Vaccine  01/03/2025 (Originally 05/06/2024)   Medicare Annual Wellness (AWV)  07/07/2025   DEXA SCAN  08/25/2025   Pneumococcal Vaccine: 50+ Years  Completed   Hepatitis C Screening  Completed   Zoster Vaccines- Shingrix  Completed   HPV VACCINES  Aged Out   Meningococcal B Vaccine  Aged Out   Colonoscopy  Discontinued    Health Maintenance Items Addressed: Vaccines Due: flu and Covid, See Nurse Notes at the end of this note  Additional Screening:  Vision Screening: Recommended annual ophthalmology exams for early detection of glaucoma and other disorders of the eye. Is the patient up to date with their annual eye exam?  Yes  Who is the provider or what is the name of the office in which the patient attends annual eye exams? Dr. Adine Novak @ [facility] [place]  Dental Screening: Recommended annual dental exams for proper oral hygiene  Community Resource Referral / Chronic Care Management: CRR required this visit?  No   CCM required this visit?  No   Plan:    I have personally reviewed and noted the following in the patient's chart:   Medical and social history Use of alcohol, tobacco or illicit drugs  Current medications and supplements including opioid prescriptions. Patient is not currently taking opioid prescriptions. Functional  ability and status Nutritional status Physical activity Advanced directives List of other physicians Hospitalizations, surgeries, and ER visits in previous 12 months Vitals Screenings to include cognitive, depression, and falls Referrals and appointments  In addition, I have reviewed and discussed with patient certain preventive protocols, quality metrics, and best practice recommendations. A written personalized care plan for preventive services as well as general preventive health recommendations were provided to patient.   Vina Ned, CMA   07/07/2024   After Visit Summary: (MyChart) Due to this being a telephonic visit, the after visit summary with patients personalized plan was offered to patient via MyChart   Notes:  Plans to get flu and covid vaccines Declined Tdap vaccine [facility] scheduled for 08/26/24 Screening colonoscopy no longer recommended due to age

## 2024-07-26 ENCOUNTER — Ambulatory Visit (INDEPENDENT_AMBULATORY_CARE_PROVIDER_SITE_OTHER): Payer: Medicare PPO

## 2024-07-26 ENCOUNTER — Encounter (INDEPENDENT_AMBULATORY_CARE_PROVIDER_SITE_OTHER): Payer: Self-pay | Admitting: Vascular Surgery

## 2024-07-26 ENCOUNTER — Ambulatory Visit (INDEPENDENT_AMBULATORY_CARE_PROVIDER_SITE_OTHER): Payer: Medicare PPO | Admitting: Vascular Surgery

## 2024-07-26 VITALS — BP 152/75 | HR 79 | Resp 18 | Ht 67.0 in | Wt 147.0 lb

## 2024-07-26 DIAGNOSIS — E782 Mixed hyperlipidemia: Secondary | ICD-10-CM

## 2024-07-26 DIAGNOSIS — I10 Essential (primary) hypertension: Secondary | ICD-10-CM | POA: Diagnosis not present

## 2024-07-26 DIAGNOSIS — I6523 Occlusion and stenosis of bilateral carotid arteries: Secondary | ICD-10-CM | POA: Diagnosis not present

## 2024-07-26 NOTE — Assessment & Plan Note (Signed)
 Carotid duplex today shows some progression of the right carotid stenosis now into the 40 to 59% range with velocities in the left carotid artery still in the 40 to 59% range although slightly higher than last study.  Continue aspirin and Crestor .  Recheck annually.

## 2024-07-26 NOTE — Progress Notes (Signed)
 MRN : [id number]  Shelia Taylor is a 78 y.o. (November 07, 1945) female who presents with chief complaint of  Chief Complaint  Patient presents with   Follow-up    1 year follow up carotid  .  History of Present Illness: Patient returns in follow-up of her carotid disease.  She is doing well today.  She denies any focal neurologic symptoms or other issues.  No new complaints since her last visit.  Carotid duplex today shows some progression of the right carotid stenosis now into the 40 to 59% range with velocities in the left carotid artery still in the 40 to 59% range although slightly higher than last study.  Current Outpatient Medications  Medication Sig Dispense Refill   aspirin 81 MG tablet Take 81 mg by mouth every other day.      Calcium  Carbonate-Vitamin D (CALCIUM  + D PO) Take by mouth 2 (two) times daily.     metoprolol  succinate (TOPROL -XL) 25 MG 24 hr tablet TAKE 1 TABLET ALTERNATING WITH 1/2 TABLET EVERY OTHER DAY. IF PRESSURES RUN LESS THAN 110, DECREASE TO 1/2 TABLET DAILY 68 tablet 3   metroNIDAZOLE (METROCREAM) 0.75 % cream Apply topically 2 (two) times daily. (Patient taking differently: Apply topically 2 (two) times daily. Uses once daily in the morning)     Multiple Vitamin (MULTIVITAMIN) tablet Take 1 tablet by mouth daily.     rosuvastatin  (CRESTOR ) 20 MG tablet TAKE 1 TABLET EVERY DAY 90 tablet 3   No current facility-administered medications for this visit.    Past Medical History:  Diagnosis Date   Allergy 07/19/19   DUB (dysfunctional uterine bleeding)    DUB (dysfunctional uterine bleeding)    Endometrial polyp    Hypertension    Mixed hyperlipidemia    Motion sickness    back seat cars   Osteoporosis     Past Surgical History:  Procedure Laterality Date   APPENDECTOMY     BREAST EXCISIONAL BIOPSY Left 1970's   NEG   CATARACT EXTRACTION, BILATERAL  2017   DILATION AND CURETTAGE OF UTERUS     TUBAL LIGATION       Social History    Tobacco Use   Smoking status: Never   Smokeless tobacco: Never  Vaping Use   Vaping status: Never Used  Substance Use Topics   Alcohol use: Yes    Alcohol/week: 7.0 standard drinks of alcohol    Types: 7 Glasses of wine per week    Comment: 1 glass of wine daily   Drug use: Never      Family History  Problem Relation Age of Onset   Diabetes Mother    Hypertension Father    Cancer Father        colon cancer   Diabetes Sister    Breast cancer Cousin        Paternal 1st cousin     Allergies  Allergen Reactions   Propofol  Other (See Comments)    Altered Mental Status after propofol  given prior to colonoscopy on 07/11/2019, per MD documentation likely not seizure.      REVIEW OF SYSTEMS (Negative unless checked)  Constitutional: [] Weight loss  [] Fever  [] Chills Cardiac: [] Chest pain   [] Chest pressure   [] Palpitations   [] Shortness of breath when laying flat   [] Shortness of breath at rest   [] Shortness of breath with exertion. Vascular:  [] Pain in legs with walking   [] Pain in legs at rest   [] Pain in legs when laying flat   []   Claudication   [] Pain in feet when walking  [] Pain in feet at rest  [] Pain in feet when laying flat   [] History of DVT   [] Phlebitis   [] Swelling in legs   [] Varicose veins   [] Non-healing ulcers Pulmonary:   [] Uses home oxygen   [] Productive cough   [] Hemoptysis   [] Wheeze  [] COPD   [] Asthma Neurologic:  [] Dizziness  [] Blackouts   [] Seizures   [] History of stroke   [] History of TIA  [] Aphasia   [] Temporary blindness   [] Dysphagia   [] Weakness or numbness in arms   [] Weakness or numbness in legs Musculoskeletal:  [x] Arthritis   [] Joint swelling   [] Joint pain   [] Low back pain Hematologic:  [] Easy bruising  [] Easy bleeding   [] Hypercoagulable state   [] Anemic  [] Hepatitis Gastrointestinal:  [] Blood in stool   [] Vomiting blood  [] Gastroesophageal reflux/heartburn   [] Difficulty swallowing. Genitourinary:  [] Chronic kidney disease   [] Difficult  urination  [] Frequent urination  [] Burning with urination   [] Blood in urine Skin:  [] Rashes   [] Ulcers   [] Wounds Psychological:  [] History of anxiety   []  History of major depression.  Physical Examination  Vitals:   07/26/24 0945  BP: (!) 152/75  Pulse: 79  Resp: 18  Weight: 147 lb (66.7 kg)  Height: 5' 7 (1.702 m)   Body mass index is 23.02 kg/m. Gen:  WD/WN, NAD. Appears younger than stated age. Head: [place]/AT, No temporalis wasting. Ear/Nose/Throat: Hearing grossly intact, nares w/o erythema or drainage, trachea midline Eyes: Conjunctiva clear. Sclera non-icteric Neck: Supple.  Left carotid bruit  Pulmonary:  Good air movement, equal and clear to auscultation bilaterally.  Cardiac: RRR, No JVD Vascular:  Vessel Right Left  Radial Palpable Palpable       Musculoskeletal: M/S 5/5 throughout.  No deformity or atrophy. No edema. Neurologic: CN 2-12 intact. Sensation grossly intact in extremities.  Symmetrical.  Speech is fluent. Motor exam as listed above. Psychiatric: Judgment intact, Mood & affect appropriate for pt's clinical situation. Dermatologic: No rashes or ulcers noted.  No cellulitis or open wounds.     CBC Lab Results  Component Value Date   WBC 6.4 06/15/2024   HGB 13.9 06/15/2024   HCT 42.3 06/15/2024   MCV 100 (H) 06/15/2024   PLT 263 06/15/2024    BMET    Component Value Date/Time   NA 140 06/15/2024 0921   K 4.9 06/15/2024 0921   CL 103 06/15/2024 0921   CO2 18 (L) 06/15/2024 0921   GLUCOSE 89 06/15/2024 0921   GLUCOSE 103 (H) 07/11/2019 1237   BUN 18 06/15/2024 0921   CREATININE 1.16 (H) 06/15/2024 0921   CALCIUM  9.9 06/15/2024 0921   GFRNONAA 52 (L) 11/15/2020 1129   GFRAA 60 11/15/2020 1129   CrCl cannot be calculated (Patient's most recent lab result is older than the maximum 21 days allowed.).  COAG No results found for: INR, PROTIME  Radiology No results found.   Assessment/Plan Bilateral carotid artery  stenosis Carotid duplex today shows some progression of the right carotid stenosis now into the 40 to 59% range with velocities in the left carotid artery still in the 40 to 59% range although slightly higher than last study.  Continue aspirin and Crestor .  Recheck annually.  Essential hypertension blood pressure control important in reducing the progression of atherosclerotic disease. On appropriate oral medications.     Mixed hyperlipidemia lipid control important in reducing the progression of atherosclerotic disease. Continue statin therapy  Selinda Gu, MD  07/26/2024 10:32 AM    This note was created with Dragon medical transcription system.  Any errors from dictation are purely unintentional

## 2024-08-26 ENCOUNTER — Ambulatory Visit
Admission: RE | Admit: 2024-08-26 | Discharge: 2024-08-26 | Disposition: A | Discharge status: Home / Self Care | Source: Ambulatory Visit | Attending: Internal Medicine | Admitting: Internal Medicine

## 2024-08-26 DIAGNOSIS — Z1231 Encounter for screening mammogram for malignant neoplasm of breast: Secondary | ICD-10-CM | POA: Diagnosis not present

## 2024-11-07 ENCOUNTER — Other Ambulatory Visit: Payer: Self-pay | Admitting: Cardiovascular Disease

## 2024-11-09 NOTE — Telephone Encounter (Signed)
 Last lipid panel 06/15/24, within normal range. Refill sent.

## 2024-12-09 ENCOUNTER — Ambulatory Visit: Admitting: Cardiovascular Disease

## 2024-12-15 ENCOUNTER — Ambulatory Visit: Admitting: Student

## 2025-07-19 ENCOUNTER — Ambulatory Visit

## 2025-07-25 ENCOUNTER — Ambulatory Visit (INDEPENDENT_AMBULATORY_CARE_PROVIDER_SITE_OTHER): Admitting: Vascular Surgery

## 2025-07-25 ENCOUNTER — Encounter (INDEPENDENT_AMBULATORY_CARE_PROVIDER_SITE_OTHER)
